# Patient Record
Sex: Female | Born: 1980 | Race: Black or African American | Hispanic: No | Marital: Single | Smoking: Current every day smoker
Health system: Southern US, Community
[De-identification: ages and names within clinical notes are randomized; demographics above are authoritative.]

## PROBLEM LIST (undated history)

## (undated) DIAGNOSIS — F319 Bipolar disorder, unspecified: Secondary | ICD-10-CM

## (undated) DIAGNOSIS — I509 Heart failure, unspecified: Secondary | ICD-10-CM

## (undated) DIAGNOSIS — I10 Essential (primary) hypertension: Secondary | ICD-10-CM

## (undated) DIAGNOSIS — K219 Gastro-esophageal reflux disease without esophagitis: Secondary | ICD-10-CM

## (undated) DIAGNOSIS — R569 Unspecified convulsions: Secondary | ICD-10-CM

## (undated) HISTORY — PX: NO PAST SURGERIES: SHX2092

---
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
## 1998-01-29 ENCOUNTER — Emergency Department (HOSPITAL_COMMUNITY): Admission: EM | Admit: 1998-01-29 | Discharge: 1998-01-29 | Payer: Self-pay | Admitting: Emergency Medicine

## 1998-01-29 ENCOUNTER — Encounter: Payer: Self-pay | Admitting: Emergency Medicine

## 1998-04-24 ENCOUNTER — Emergency Department (HOSPITAL_COMMUNITY): Admission: EM | Admit: 1998-04-24 | Discharge: 1998-04-25 | Payer: Self-pay | Admitting: Emergency Medicine

## 1998-04-28 ENCOUNTER — Emergency Department (HOSPITAL_COMMUNITY): Admission: EM | Admit: 1998-04-28 | Discharge: 1998-04-28 | Payer: Self-pay | Admitting: Emergency Medicine

## 2002-05-11 ENCOUNTER — Encounter: Payer: Self-pay | Admitting: Emergency Medicine

## 2002-05-11 ENCOUNTER — Emergency Department (HOSPITAL_COMMUNITY): Admission: EM | Admit: 2002-05-11 | Discharge: 2002-05-11 | Payer: Self-pay | Admitting: Emergency Medicine

## 2002-05-18 ENCOUNTER — Other Ambulatory Visit: Admission: RE | Admit: 2002-05-18 | Discharge: 2002-05-18 | Payer: Self-pay | Admitting: Obstetrics and Gynecology

## 2002-07-09 ENCOUNTER — Encounter: Payer: Self-pay | Admitting: Emergency Medicine

## 2002-07-09 ENCOUNTER — Emergency Department (HOSPITAL_COMMUNITY): Admission: EM | Admit: 2002-07-09 | Discharge: 2002-07-09 | Payer: Self-pay | Admitting: Emergency Medicine

## 2002-09-07 ENCOUNTER — Encounter: Admission: RE | Admit: 2002-09-07 | Discharge: 2002-09-07 | Payer: Self-pay | Admitting: Endocrinology

## 2002-09-07 ENCOUNTER — Encounter: Payer: Self-pay | Admitting: Endocrinology

## 2004-03-19 ENCOUNTER — Emergency Department (HOSPITAL_COMMUNITY): Admission: EM | Admit: 2004-03-19 | Discharge: 2004-03-19 | Payer: Self-pay | Admitting: Family Medicine

## 2004-03-28 ENCOUNTER — Inpatient Hospital Stay (HOSPITAL_COMMUNITY): Admission: EM | Admit: 2004-03-28 | Discharge: 2004-03-29 | Payer: Self-pay | Admitting: Emergency Medicine

## 2004-03-29 ENCOUNTER — Ambulatory Visit: Payer: Self-pay | Admitting: Psychiatry

## 2004-03-29 ENCOUNTER — Inpatient Hospital Stay (HOSPITAL_COMMUNITY): Admission: AD | Admit: 2004-03-29 | Discharge: 2004-04-01 | Payer: Self-pay | Admitting: Psychiatry

## 2004-04-16 ENCOUNTER — Emergency Department (HOSPITAL_COMMUNITY): Admission: EM | Admit: 2004-04-16 | Discharge: 2004-04-16 | Payer: Self-pay | Admitting: Emergency Medicine

## 2004-05-08 ENCOUNTER — Inpatient Hospital Stay (HOSPITAL_COMMUNITY): Admission: AD | Admit: 2004-05-08 | Discharge: 2004-05-08 | Payer: Self-pay | Admitting: Obstetrics and Gynecology

## 2004-05-29 ENCOUNTER — Other Ambulatory Visit: Admission: RE | Admit: 2004-05-29 | Discharge: 2004-05-29 | Payer: Self-pay | Admitting: Obstetrics and Gynecology

## 2004-08-02 ENCOUNTER — Inpatient Hospital Stay (HOSPITAL_COMMUNITY): Admission: EM | Admit: 2004-08-02 | Discharge: 2004-08-06 | Payer: Self-pay | Admitting: Psychiatry

## 2004-08-02 ENCOUNTER — Ambulatory Visit: Payer: Self-pay | Admitting: Psychiatry

## 2005-01-30 ENCOUNTER — Inpatient Hospital Stay (HOSPITAL_COMMUNITY): Admission: RE | Admit: 2005-01-30 | Discharge: 2005-02-03 | Payer: Self-pay | Admitting: Psychiatry

## 2005-01-31 ENCOUNTER — Ambulatory Visit: Payer: Self-pay | Admitting: Psychiatry

## 2005-05-04 ENCOUNTER — Emergency Department (HOSPITAL_COMMUNITY): Admission: EM | Admit: 2005-05-04 | Discharge: 2005-05-04 | Payer: Self-pay | Admitting: Emergency Medicine

## 2005-07-30 ENCOUNTER — Ambulatory Visit: Payer: Self-pay | Admitting: Family Medicine

## 2005-08-04 ENCOUNTER — Emergency Department (HOSPITAL_COMMUNITY): Admission: EM | Admit: 2005-08-04 | Discharge: 2005-08-04 | Payer: Self-pay | Admitting: Emergency Medicine

## 2005-09-19 ENCOUNTER — Encounter: Payer: Self-pay | Admitting: Emergency Medicine

## 2005-09-20 ENCOUNTER — Ambulatory Visit: Payer: Self-pay | Admitting: Psychiatry

## 2005-09-20 ENCOUNTER — Inpatient Hospital Stay (HOSPITAL_COMMUNITY): Admission: EM | Admit: 2005-09-20 | Discharge: 2005-09-22 | Payer: Self-pay | Admitting: Psychiatry

## 2010-04-27 ENCOUNTER — Emergency Department (HOSPITAL_COMMUNITY)
Admission: EM | Admit: 2010-04-27 | Discharge: 2010-04-27 | Discharge status: Against Medical Advice | Payer: Medicare Other | Attending: Emergency Medicine | Admitting: Emergency Medicine

## 2010-04-27 ENCOUNTER — Emergency Department (HOSPITAL_COMMUNITY): Payer: Medicare Other

## 2010-04-27 DIAGNOSIS — R05 Cough: Secondary | ICD-10-CM | POA: Insufficient documentation

## 2010-04-27 DIAGNOSIS — I509 Heart failure, unspecified: Secondary | ICD-10-CM | POA: Insufficient documentation

## 2010-04-27 DIAGNOSIS — R0602 Shortness of breath: Secondary | ICD-10-CM | POA: Insufficient documentation

## 2010-04-27 DIAGNOSIS — R0609 Other forms of dyspnea: Secondary | ICD-10-CM | POA: Insufficient documentation

## 2010-04-27 DIAGNOSIS — F411 Generalized anxiety disorder: Secondary | ICD-10-CM | POA: Insufficient documentation

## 2010-04-27 DIAGNOSIS — M542 Cervicalgia: Secondary | ICD-10-CM | POA: Insufficient documentation

## 2010-04-27 DIAGNOSIS — F3289 Other specified depressive episodes: Secondary | ICD-10-CM | POA: Insufficient documentation

## 2010-04-27 DIAGNOSIS — E669 Obesity, unspecified: Secondary | ICD-10-CM | POA: Insufficient documentation

## 2010-04-27 DIAGNOSIS — R079 Chest pain, unspecified: Secondary | ICD-10-CM | POA: Insufficient documentation

## 2010-04-27 DIAGNOSIS — F329 Major depressive disorder, single episode, unspecified: Secondary | ICD-10-CM | POA: Insufficient documentation

## 2010-04-27 DIAGNOSIS — R0989 Other specified symptoms and signs involving the circulatory and respiratory systems: Secondary | ICD-10-CM | POA: Insufficient documentation

## 2010-04-27 DIAGNOSIS — R059 Cough, unspecified: Secondary | ICD-10-CM | POA: Insufficient documentation

## 2010-04-27 DIAGNOSIS — R51 Headache: Secondary | ICD-10-CM | POA: Insufficient documentation

## 2010-04-27 DIAGNOSIS — M79609 Pain in unspecified limb: Secondary | ICD-10-CM | POA: Insufficient documentation

## 2010-04-27 DIAGNOSIS — H5710 Ocular pain, unspecified eye: Secondary | ICD-10-CM | POA: Insufficient documentation

## 2010-06-03 ENCOUNTER — Emergency Department (HOSPITAL_COMMUNITY)
Admission: EM | Admit: 2010-06-03 | Discharge: 2010-06-03 | Disposition: A | Discharge status: Home / Self Care | Payer: Medicare Other | Attending: Emergency Medicine | Admitting: Emergency Medicine

## 2010-06-03 DIAGNOSIS — R000 Tachycardia, unspecified: Secondary | ICD-10-CM | POA: Insufficient documentation

## 2010-06-03 DIAGNOSIS — IMO0002 Reserved for concepts with insufficient information to code with codable children: Secondary | ICD-10-CM | POA: Insufficient documentation

## 2010-06-03 DIAGNOSIS — F209 Schizophrenia, unspecified: Secondary | ICD-10-CM | POA: Insufficient documentation

## 2010-06-03 DIAGNOSIS — F411 Generalized anxiety disorder: Secondary | ICD-10-CM | POA: Insufficient documentation

## 2010-06-03 LAB — CBC
HCT: 39.4 % (ref 36.0–46.0)
Hemoglobin: 13 g/dL (ref 12.0–15.0)
MCH: 29.4 pg (ref 26.0–34.0)
MCHC: 33 g/dL (ref 30.0–36.0)
MCV: 89.1 fL (ref 78.0–100.0)
Platelets: 228 10*3/uL (ref 150–400)
RBC: 4.42 MIL/uL (ref 3.87–5.11)
RDW: 12.4 % (ref 11.5–15.5)
WBC: 8 10*3/uL (ref 4.0–10.5)

## 2010-06-03 LAB — DIFFERENTIAL
Basophils Absolute: 0 10*3/uL (ref 0.0–0.1)
Basophils Relative: 0 % (ref 0–1)
Eosinophils Absolute: 0.1 10*3/uL (ref 0.0–0.7)
Eosinophils Relative: 1 % (ref 0–5)
Lymphocytes Relative: 38 % (ref 12–46)
Lymphs Abs: 3.1 10*3/uL (ref 0.7–4.0)
Monocytes Absolute: 0.6 10*3/uL (ref 0.1–1.0)
Monocytes Relative: 8 % (ref 3–12)
Neutro Abs: 4.3 10*3/uL (ref 1.7–7.7)
Neutrophils Relative %: 53 % (ref 43–77)

## 2010-06-03 LAB — BASIC METABOLIC PANEL
Calcium: 9.5 mg/dL (ref 8.4–10.5)
Creatinine, Ser: 0.98 mg/dL (ref 0.4–1.2)
GFR calc Af Amer: >60 mL/min (ref >60)
GFR calc non Af Amer: >60 mL/min (ref >60)
Glucose, Bld: 97 mg/dL (ref 70–99)
Sodium: 137 mEq/L (ref 135–145)

## 2010-06-03 LAB — RAPID URINE DRUG SCREEN, HOSP PERFORMED
Opiates: NOT DETECTED
Tetrahydrocannabinol: NOT DETECTED

## 2010-06-03 LAB — GLUCOSE, CAPILLARY: Glucose-Capillary: 89 mg/dL (ref 70–99)

## 2010-08-19 ENCOUNTER — Inpatient Hospital Stay (HOSPITAL_COMMUNITY)
Admission: AD | Admit: 2010-08-19 | Discharge: 2010-08-24 | DRG: 885 | Disposition: A | Discharge status: Home / Self Care | Payer: Medicare Other | Source: Ambulatory Visit | Attending: Psychiatry | Admitting: Psychiatry

## 2010-08-19 ENCOUNTER — Emergency Department (HOSPITAL_COMMUNITY)
Admission: EM | Admit: 2010-08-19 | Discharge: 2010-08-19 | Disposition: A | Discharge status: Other Acute Inpatient Hospital | Payer: Medicare Other | Source: Home / Self Care | Attending: Emergency Medicine | Admitting: Emergency Medicine

## 2010-08-19 DIAGNOSIS — R45851 Suicidal ideations: Secondary | ICD-10-CM | POA: Insufficient documentation

## 2010-08-19 DIAGNOSIS — F411 Generalized anxiety disorder: Secondary | ICD-10-CM | POA: Insufficient documentation

## 2010-08-19 DIAGNOSIS — F329 Major depressive disorder, single episode, unspecified: Secondary | ICD-10-CM | POA: Insufficient documentation

## 2010-08-19 DIAGNOSIS — I509 Heart failure, unspecified: Secondary | ICD-10-CM

## 2010-08-19 DIAGNOSIS — F3160 Bipolar disorder, current episode mixed, unspecified: Principal | ICD-10-CM

## 2010-08-19 DIAGNOSIS — F3289 Other specified depressive episodes: Secondary | ICD-10-CM | POA: Insufficient documentation

## 2010-08-19 DIAGNOSIS — IMO0001 Reserved for inherently not codable concepts without codable children: Secondary | ICD-10-CM

## 2010-08-19 DIAGNOSIS — R443 Hallucinations, unspecified: Secondary | ICD-10-CM | POA: Insufficient documentation

## 2010-08-19 LAB — URINE MICROSCOPIC-ADD ON

## 2010-08-19 LAB — COMPREHENSIVE METABOLIC PANEL
ALT: 17 U/L (ref 0–35)
Alkaline Phosphatase: 48 U/L (ref 39–117)
BUN: 6 mg/dL (ref 6–23)
CO2: 20 mEq/L (ref 19–32)
Chloride: 107 mEq/L (ref 96–112)
Glucose, Bld: 91 mg/dL (ref 70–99)
Potassium: 4 mEq/L (ref 3.5–5.1)
Sodium: 138 mEq/L (ref 135–145)
Total Bilirubin: 0.3 mg/dL (ref 0.3–1.2)

## 2010-08-19 LAB — CBC
HCT: 38.6 % (ref 36.0–46.0)
Hemoglobin: 13.6 g/dL (ref 12.0–15.0)
MCV: 85.6 fL (ref 78.0–100.0)
RBC: 4.51 MIL/uL (ref 3.87–5.11)
WBC: 7.8 10*3/uL (ref 4.0–10.5)

## 2010-08-19 LAB — URINALYSIS, ROUTINE W REFLEX MICROSCOPIC
Glucose, UA: NEGATIVE mg/dL
Ketones, ur: 15 mg/dL — AB
Nitrite: NEGATIVE
Protein, ur: NEGATIVE mg/dL
pH: 7 (ref 5.0–8.0)

## 2010-08-19 LAB — DIFFERENTIAL
Basophils Absolute: 0 10*3/uL (ref 0.0–0.1)
Eosinophils Relative: 1 % (ref 0–5)
Lymphocytes Relative: 36 % (ref 12–46)
Lymphs Abs: 2.8 10*3/uL (ref 0.7–4.0)
Neutro Abs: 4.3 10*3/uL (ref 1.7–7.7)
Neutrophils Relative %: 56 % (ref 43–77)

## 2010-08-19 LAB — RAPID URINE DRUG SCREEN, HOSP PERFORMED
Cocaine: NOT DETECTED
Tetrahydrocannabinol: NOT DETECTED

## 2010-08-19 LAB — POCT PREGNANCY, URINE: Preg Test, Ur: NEGATIVE

## 2010-08-19 LAB — ETHANOL: Alcohol, Ethyl (B): <11 mg/dL — ABNORMAL HIGH (ref 0–10)

## 2010-08-20 LAB — URINE CULTURE
Culture  Setup Time: 201206061256
Culture: NO GROWTH

## 2010-08-21 DIAGNOSIS — F259 Schizoaffective disorder, unspecified: Secondary | ICD-10-CM

## 2010-08-21 LAB — TSH: TSH: 3.596 u[IU]/mL (ref 0.350–4.500)

## 2010-08-22 NOTE — H&P (Signed)
NAME:  Rebecca Lozano, [name]                  ACCOUNT NO.:  [id number]  MEDICAL RECORD NO.:  [id number]  LOCATION:  0405                          FACILITY:  [facility]  PHYSICIAN:  Eulogio Ditch, MD DATE OF BIRTH:  Jul 15, 1980  DATE OF ADMISSION:  08/19/2010 DATE OF DISCHARGE:                      PSYCHIATRIC ADMISSION ASSESSMENT   DATE OF ASSESSMENT:  August 20, 2010.  IDENTIFICATION:  This is a 30 year old African American female.  This is a voluntary admission.  HISTORY OF PRESENT ILLNESS:  This is the first [facility] admission for Rebecca Lozano who presents by way of our emergency room complaining of suicidal thoughts.  She reports she has been dealing with a lot of family stress, and her medication had been recently switched from Lexapro to Wellbutrin 2 weeks ago.  Since then she had noticed an increase in auditory hallucinations and felt that her mood was more unstable.  She has had some thoughts of overdosing.  PAST PSYCHIATRIC HISTORY:  Currently followed as an outpatient by [facility] counseling.  The patient reports history of previous admissions to almost every psychiatric hospital in our state.  Says this started around 2000 when she was abusing various substances and was first admitted to [facility] in [place], [place].  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  Previously being [place] at mental health services in [place] until she moved to [place] in January 2012.  Prior to the mood, she had had frequent trials of various medications in an attempt to deal with her mood lability.  This involved taking Xanax followed by Saphris and several other kinds of medications.  She has been on Topamax a very long time for mood stability.  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  She is requesting help with mood stability.  She is pleased with taking the Topamax because it has helped her with weight control.  SOCIAL HISTORY:  Single African American female. Reports some frustration with social relationships.  Now living in transitional housing in [place].  Has a brother who lives locally and attends a [employer].  She reports that she is attending group therapy through [facility] counseling.  MEDICAL HISTORY:  Primary care physician is Dr. Aundria Rud at the [facility].  Medical problems are fibromyalgia and history of congestive heart failure.  CURRENT MEDICATIONS: 1. Wellbutrin SR 150 mg 1 tablet daily. 2. Geodon 240 mg p.o. in the evening. 3. Topamax 100 mg b.i.d. 4. Nexium 40 mg daily. 5. Vitamin B12 1 tablet orally daily. 6. Flexeril 10 mg daily. 7. Ambien 10 mg q.h.s. 8. Vistaril 50 mg q.6h p.r.n. stool softener one cap daily. 9. Multivitamin one daily. 10.Lisinopril 10 mg q.a.m. 11.Cogentin 0.5 mg 1 tablet twice daily.  DRUG ALLERGIES:  SEROQUEL, LAMICTAL AND ABILIFY AND ASPIRIN AND PENICILLIN, ARTANE AND LEXAPRO.  Physical examination was done in the [facility] emergency room and is noted in the record.  This is an overweight African American female, normally-developed, adequately groomed, in no acute physical distress today, pleasant, cooperative  with a mildly labile affect, sad at times. ADMITTING VITAL SIGNS:  Temperature 99, pulse 73, respirations 16, blood pressure 126/92.  Her CBC is normal. Hemoglobin 13.6.  Chemistry normal. BUN 6, creatinine 0.95.  Liver enzymes are normal.  Alcohol screen negative. Urine drug screen negative. Pregnancy test is negative and urinalysis remarkable for 7-10 WBCs per high-powered field and a small amount of leukocyte esterase, 15 mg of ketones.  MENTAL STATUS EXAM:  Fully alert female, pleasant, cooperative, pleasant on approach. Good eye contact, constricted  affect, some lability to her affect but it is very slight. She is initially rather guarded and stand offish but warms up with conversation, is engageable, pleasant.  Has concerns about wanting to get her mood stable. Discouraged with trying to establish personal relationships.  Thinking is logical.  She does not appear internally distracted.  Denying any hallucinations. Her only complaint today is some drowsiness from an a.m. Geodon dose.  Thinking is logical.  Intelligence average.  Insight adequate.  Impulse control and judgment normal.  Has had some passive suicidal thoughts.  No plans today. Denies any active suicidal thoughts today.  DIAGNOSES:  AXIS I:  Mood disorder NOS, rule out schizoaffective disorder, bipolar type by history, history of polysubstance abuse in remission. AXIS II:  No diagnosis. AXIS III:  History of congestive heart failure and fibromyalgia. AXIS IV:  Deferred. AXIS V:  Current 40. Past year not known.  Plan is to voluntarily admit her with a goal of alleviating her suicidal thoughts and adequately stabilizing her mood.  We have already elected to discontinue the Wellbutrin as she feels that this exacerbated her suicidal thoughts.  We had initially placed her on Geodon 80 mg twice daily but will do Geodon 160 mg q.1800 with evening meal and Vistaril 50 mg in the morning and at 1800 and 50 mg one-time daily p.r.n.  We are going to continue her Topamax and talked about the social supports in [place] and some of her options.  Ego supportive psychotherapy given today, and medication education done.     Margaret A. Lorin Picket, N.P.   ______________________________ Eulogio Ditch, MD   MAS/MEDQ  D:  08/21/2010  T:  08/21/2010  Job:  [id number]  Electronically Signed by Kari Baars N.P. on 08/21/2010 05:02:28 PM Electronically Signed by Eulogio Ditch  on 08/22/2010 06:04:26 PM

## 2010-08-23 LAB — URINALYSIS, ROUTINE W REFLEX MICROSCOPIC
Bilirubin Urine: NEGATIVE
Glucose, UA: NEGATIVE mg/dL
Ketones, ur: NEGATIVE mg/dL
Protein, ur: NEGATIVE mg/dL
pH: 7.5 (ref 5.0–8.0)

## 2010-08-23 LAB — URINE MICROSCOPIC-ADD ON

## 2010-09-01 NOTE — Discharge Summary (Signed)
  NAME:  Capriotti, Jakelin                  ACCOUNT NO.:  [id number]  MEDICAL RECORD NO.:  [id number]  LOCATION:  0405                          FACILITY:  [facility]  PHYSICIAN:  Eulogio Ditch, MD DATE OF BIRTH:  20-Jul-1980  DATE OF ADMISSION:  08/19/2010 DATE OF DISCHARGE:  08/24/2010                              DISCHARGE SUMMARY   HISTORY OF PRESENT ILLNESS:  A 30 year old female who was admitted for suicidal thoughts and for paranoid behavior.  The patient reported that her medicine was switched from Lexapro to Wellbutrin 2 weeks ago.  Since then she became paranoid and started hearing voices and reported her mood was unstable.  HOSPITAL COURSE:  During the hospital stay the patient's Wellbutrin was discontinued.  The patient was on Geodon 240 mg in the evening.  She was given 160 mg and I explained to her that she need not to go up to such a high dose as the side effects increase and the benefits are not much as compared to 160 mg and approved dose is up to 160 mg p.o. daily.  The patient was continued on Topamax 100 mg b.i.d. along with Cogentin 0.5 mg 1 tablet twice daily.  The patient responded to the medication well.  She participated in all the groups and on June 11 when I saw the patient the patient was very calm, cooperative, logical and goal-directed, not suicidal or homicidal, not delusional or hallucinating.  The patient denied any side effects from the medications.  The patient reported that she lives in a community place and she has a lot of help there.  It is like a transition place where she has a lot of support.  The patient reported that she did not have any kids or any boyfriend.  She feels lonely sometimes but the place where she lives she has a lot of help.  The patient reported that she is sober from the drugs for the last 5 years.  DISCHARGE DIAGNOSES:  AXIS I:  Bipolar disorder mixed type. AXIS II:  Deferred. AXIS III:  History of congestive heart failure  and fibromyalgia. AXIS IV:  Chronic mental health issues. AXIS V:  60.  DISCHARGE MEDICATIONS: 1. Cogentin 0.5 mg p.o. daily. 2. Geodon 160 mg p.o. daily. 3. Along with her other medical meds like calcium carbonate,     lisinopril, Protonix, Topamax and Ambien.  DISCHARGE FOLLOWUP:  The patient will follow up with [facility], phone number [phone].  Appointment on June 28 at 1:00 p.m.  She will also follow up with [employer] community support team, phone number [phone], appointment of June 13 at 3:00 p.m.     Eulogio Ditch, MD     SA/MEDQ  D:  08/24/2010  T:  08/24/2010  Job:  [id number]  Electronically Signed by Eulogio Ditch  on 09/01/2010 09:55:37 AM

## 2010-09-04 ENCOUNTER — Emergency Department (HOSPITAL_COMMUNITY)
Admission: EM | Admit: 2010-09-04 | Discharge: 2010-09-04 | Discharge status: Against Medical Advice | Payer: Medicare Other | Attending: Emergency Medicine | Admitting: Emergency Medicine

## 2010-09-04 DIAGNOSIS — R300 Dysuria: Secondary | ICD-10-CM | POA: Insufficient documentation

## 2014-01-05 ENCOUNTER — Encounter (HOSPITAL_COMMUNITY): Payer: Self-pay | Admitting: Emergency Medicine

## 2014-01-05 ENCOUNTER — Emergency Department (HOSPITAL_COMMUNITY): Payer: Medicare Other

## 2014-01-05 ENCOUNTER — Emergency Department (HOSPITAL_COMMUNITY)
Admission: EM | Admit: 2014-01-05 | Discharge: 2014-01-05 | Disposition: A | Discharge status: Home / Self Care | Payer: Medicare Other | Attending: Emergency Medicine | Admitting: Emergency Medicine

## 2014-01-05 DIAGNOSIS — I509 Heart failure, unspecified: Secondary | ICD-10-CM | POA: Insufficient documentation

## 2014-01-05 DIAGNOSIS — K219 Gastro-esophageal reflux disease without esophagitis: Secondary | ICD-10-CM | POA: Insufficient documentation

## 2014-01-05 DIAGNOSIS — S4991XA Unspecified injury of right shoulder and upper arm, initial encounter: Secondary | ICD-10-CM | POA: Diagnosis present

## 2014-01-05 DIAGNOSIS — M25511 Pain in right shoulder: Secondary | ICD-10-CM

## 2014-01-05 DIAGNOSIS — I10 Essential (primary) hypertension: Secondary | ICD-10-CM | POA: Insufficient documentation

## 2014-01-05 DIAGNOSIS — Z720 Tobacco use: Secondary | ICD-10-CM | POA: Diagnosis not present

## 2014-01-05 DIAGNOSIS — F319 Bipolar disorder, unspecified: Secondary | ICD-10-CM | POA: Insufficient documentation

## 2014-01-05 DIAGNOSIS — Z79899 Other long term (current) drug therapy: Secondary | ICD-10-CM | POA: Diagnosis not present

## 2014-01-05 HISTORY — DX: Essential (primary) hypertension: I10

## 2014-01-05 HISTORY — DX: Bipolar disorder, unspecified: F31.9

## 2014-01-05 HISTORY — DX: Heart failure, unspecified: I50.9

## 2014-01-05 HISTORY — DX: Gastro-esophageal reflux disease without esophagitis: K21.9

## 2014-01-05 MED ORDER — HYDROCODONE-ACETAMINOPHEN 5-325 MG PO TABS
2.0000 | ORAL_TABLET | Freq: Once | ORAL | Status: AC
  Administered 2014-01-05: 2 via ORAL
  Filled 2014-01-05: qty 2

## 2014-01-05 MED ORDER — IBUPROFEN 800 MG PO TABS: 800.0000 mg | ORAL_TABLET | Freq: Three times a day (TID) | ORAL | Status: DC

## 2014-01-05 NOTE — ED Notes (Signed)
Patient transported to X-ray 
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
## 2014-01-05 NOTE — Discharge Instructions (Signed)
Return to the emergency room with worsening of symptoms, new symptoms or with symptoms that are concerning. RICE: Rest, Ice (three cycles of 20 mins on, 20mins off at least twice a day), compression/brace, elevation. Heating pad works well for back pain. Ibuprofen 800mg  every 6 hours for 3-5 days and then as needed for pain. Follow up with PCP if symptoms worsen or are persistent.   Adhesive Capsulitis Sometimes the shoulder becomes stiff and is painful to move. Some people say it feels as if the shoulder is frozen in place. Because of this, the condition is called "frozen shoulder." Its medical name is adhesive capsulitis.  The shoulder joint is made up of strong connective tissue that attaches the ball of the humerus to the shallow shoulder socket. This strong connective tissue is called the joint capsule. This tissue can become stiff and swollen. That is when adhesive capsulitis sets in. CAUSES  It is not always clear just what the cause adhesive capsulitis. Possibilities include:  Injury to the shoulder joint.  Strain. This is a repetitive injury brought about by overuse.  Lack of use. Perhaps your arm or hand was otherwise injured. It might have been in a sling for awhile. Or perhaps you were not using it to avoid pain.  Referred pain. This is a sort of trick the body plays. You feel pain in the shoulder. But, the pain actually comes from an injury somewhere else in the body.  Long-standing health problems. Several diseases can cause adhesive capsulitis. They include diabetes, heart disease, stroke, thyroid problems, rheumatoid arthritis and lung disease.  Being a women older than 40. Anyone can develop adhesive capsulitis but it is most common in women in this age group. SYMPTOMS   Pain.  It occurs when the arm is moved.  Parts of the shoulder might hurt if they are touched.  Pain is worse at night or when resting.  Soreness. It might not be strong enough to be called pain.  But, the shoulder aches.  The shoulder does not move freely.  Muscle spasms.  Trouble sleeping because of shoulder ache or pain. DIAGNOSIS  To decide if you have adhesive capsulitis, your healthcare provider will probably:  Ask about symptoms you have noticed.  Ask about your history of joint pain and anything that might have caused the pain.  Ask about your overall health.  Use hands to feel your shoulder and neck.  Ask you to move your shoulder in specific directions. This may indicate the origin of the pain.  Order imaging tests; pictures of the shoulder. They help pinpoint the source of the problem. An X-ray might be used. For more detail, an MRI is often used. An MRI details the tendons, muscles and ligaments as well as the joint. TREATMENT  Adhesive capsulitis can be treated several ways. Most treatments can be done in a clinic or in your healthcare provider's office. Be sure to discuss the different options with your caregiver. They include:  Physical therapy. You will work on specific exercises to get your shoulder moving again. The exercises usually involve stretching. A physical therapist (a caregiver with special training) can show you what to do and what not to do. The exercises will need to be done daily.  Medication.  Over-the-counter medicines may relieve pain and inflammation (the body's way of reacting to injury or infection).  Corticosteroids. These are stronger drugs to reduce pain and inflammation. They are given by injection (shots) into the shoulder joint. Frequent treatment is not  recommended.  Muscle relaxants. Medication may be prescribed to ease muscle spasms.  Treatment of underlying conditions. This means treating another condition that is causing your shoulder problem. This might be a rotator cuff (tendon) problem  Shoulder manipulation. The shoulder will be moved by your healthcare provider. You would be under general anesthesia (given a drug that  puts you to sleep). You would not feel anything. Sometimes the joint will be injected with salt water (saline) at high pressure to break down internal scarring in the joint capsule.  Surgery. This is rarely needed. It may be suggested in advanced cases after all other treatment has failed. PROGNOSIS  In time, most people recover from adhesive capsulitis. Sometimes, however, the pain goes away but full movement of the shoulder does not return.  HOME CARE INSTRUCTIONS   Take any pain medications recommended by your healthcare provider. Follow the directions carefully.  If you have physical therapy, follow through with the therapist's suggestions. Be sure you understand the exercises you will be doing. You should understand:  How often the exercises should be done.  How many times each exercise should be repeated.  How long they should be done.  What other activities you should do, or not do.  That you should warm up before doing any exercise. Just 5 to 10 minutes will help. Small, gentle movements should get your shoulder ready for more.  Avoid high-demand exercise that involves your shoulder such as throwing. This type of exercise can make pain worse.  Consider using cold packs. Cold may ease swelling and pain. Ask your healthcare provider if a cold pack might help you. If so, get directions on how and when to use them. SEEK MEDICAL CARE IF:   You have any questions about your medications.  Your pain continues to increase. Document Released: 12/27/2008 Document Revised: 05/24/2011 Document Reviewed: 12/27/2008 [facility] Patient Information 2015 [employer] [place], [place]. This information is not intended to replace advice given to you by your health care provider. Make sure you discuss any questions you have with your health care provider.

## 2014-01-05 NOTE — ED Notes (Signed)
Patient returned from X-ray 
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
## 2014-01-06 NOTE — ED Provider Notes (Signed)
Medical screening examination/treatment/procedure(s) were performed by non-physician practitioner and as supervising physician I was immediately available for consultation/collaboration.   EKG Interpretation None      Floraine Buechler, MD, FACEP   Jamine Highfill L Viann Nielson, MD 01/06/14 1500 

## 2014-12-12 ENCOUNTER — Emergency Department: Admit: 2014-12-12 | Payer: MEDICAID

## 2014-12-12 ENCOUNTER — Inpatient Hospital Stay
Admit: 2014-12-12 | Discharge: 2014-12-12 | Disposition: A | Discharge status: Home / Self Care | Payer: MEDICAID | Attending: Emergency Medicine

## 2014-12-12 DIAGNOSIS — S93402A Sprain of unspecified ligament of left ankle, initial encounter: Secondary | ICD-10-CM

## 2014-12-12 MED ORDER — ACETAMINOPHEN 325 MG TABLET
325 mg | Freq: Once | ORAL | Status: AC
Start: 2014-12-12 — End: 2014-12-12
  Administered 2014-12-12: 16:00:00 via ORAL

## 2014-12-12 MED ORDER — NAPROXEN 250 MG TAB
250 mg | Freq: Once | ORAL | Status: AC
Start: 2014-12-12 — End: 2014-12-12
  Administered 2014-12-12: 17:00:00 via ORAL

## 2014-12-12 MED ORDER — NAPROXEN 375 MG TAB
375 mg | ORAL_TABLET | Freq: Two times a day (BID) | ORAL | 0 refills | Status: AC
Start: 2014-12-12 — End: ?

## 2014-12-12 MED ORDER — ACETAMINOPHEN 325 MG TABLET
325 mg | ORAL_TABLET | ORAL | 0 refills | Status: AC | PRN
Start: 2014-12-12 — End: ?

## 2014-12-12 MED FILL — MAPAP (ACETAMINOPHEN) 325 MG TABLET: 325 mg | ORAL | Qty: 2

## 2014-12-12 MED FILL — NAPROXEN 250 MG TAB: 250 mg | ORAL | Qty: 2

## 2014-12-12 NOTE — ED Triage Notes (Addendum)
See nursing note

## 2014-12-12 NOTE — ED Notes (Signed)
Discharged by provider.  Left before receiving meal tray.  Snack provided with medication.  Discharged with splint and crutches.

## 2014-12-12 NOTE — ED Provider Notes (Signed)
HPI Comments: Joan Molina is a 34 y.o. Female tobacco user (1ppd x 14yrs) who presents to [facility] ED via EMS with cc left ankle and foot pain, secondary to slipped on wet steps x 45 mins prior to arrival. The patient states that she was outside walking on some wet steps, when she lost her footing and fell. She denies any head trauma or loss of consciousness during the incident. She also reports that immediately after the fall she was able to ambulate, but as more time passes it has become more and more difficult to walk or move her left foot. She denies all other symptoms at this time, including any symptoms of numbness/tingling in her foot or ankle.      PCP: None      There are no other complaints changes or physical findings at this time  Written by Beatriz Chancellor, ED Scribe as dictated by Bradd Burner, MD.       Past Medical History:   Diagnosis Date   ??? Congestive heart failure (CHF) (HCC) 2011   ??? Hypertension    ??? Psychiatric disorder      Bipolar       History reviewed. No pertinent past surgical history.      History reviewed. No pertinent family history.    Social History     Social History   ??? Marital status: SINGLE     Spouse name: N/A   ??? Number of children: N/A   ??? Years of education: N/A     Occupational History   ??? Not on file.     Social History Main Topics   ??? Smoking status: Current Every Day Smoker     Packs/day: 1.00     Years: 14.00   ??? Smokeless tobacco: Not on file   ??? Alcohol use No   ??? Drug use: Yes     Special: Marijuana   ??? Sexual activity: Not on file     Other Topics Concern   ??? Not on file     Social History Narrative   ??? No narrative on file         ALLERGIES: Penicillins    Review of Systems   Constitutional: Negative for chills and fever.   HENT: Negative for congestion and rhinorrhea.    Eyes: Negative for visual disturbance.   Respiratory: Negative for cough and shortness of breath.    Cardiovascular: Negative for chest pain.    Gastrointestinal: Negative for abdominal pain, nausea and vomiting.   Genitourinary: Negative for difficulty urinating and dysuria.   Musculoskeletal: Positive for arthralgias and myalgias. Negative for back pain and neck pain.   Skin: Negative for rash.   Neurological: Negative for dizziness, numbness and headaches.   All other systems reviewed and are negative.      Patient Vitals for the past 12 hrs:   Temp Pulse Resp BP SpO2   12/12/14 1232 - - - (!) 138/97 100 %   12/12/14 1132 98.7 ??F (37.1 ??C) 79 18 (!) 153/98 100 %       Physical Exam   Constitutional: She is oriented to person, place, and time. She appears well-developed and well-nourished.   HENT:   Head: Atraumatic.   Eyes: EOM are normal. Pupils are equal, round, and reactive to light.   Neck: Normal range of motion.   Cardiovascular: Normal rate, regular rhythm, normal heart sounds and intact distal pulses.    Pulmonary/Chest: Effort normal and breath sounds normal.  Abdominal: Soft. Bowel sounds are normal.   Musculoskeletal: She exhibits tenderness.   Left lateral ankle tenderness  Tenderness at the base of the 5th metatarsal  No knee or hip involvement   Neurological: She is alert and oriented to person, place, and time.   Skin: Skin is warm and dry.   Psychiatric: She has a normal mood and affect. Her behavior is normal.   Nursing note and vitals reviewed.       MDM  Number of Diagnoses or Management Options  Sprain of left ankle, unspecified ligament, initial encounter:   Diagnosis management comments: DDx: Strain, Fracture, Dislocation       Amount and/or Complexity of Data Reviewed  Tests in the radiology section of CPT??: ordered and reviewed  Review and summarize past medical records: yes  Independent visualization of images, tracings, or specimens: yes    Patient Progress  Patient progress: stable    ED Course       Procedures    LABORATORY TESTS:  No results found for this or any previous visit (from the past 12 hour(s)).    IMAGING RESULTS:     EXAM: XR FOOT LT MIN 3 V  History: Fall, foot pain  INDICATION: fall pain. ??  ??  COMPARISON: None.  ??  FINDINGS: Three views of the left foot demonstrate no fracture or other acute  osseous or articular abnormality. The soft tissues are within normal limits. ??  ????  ??  IMPRESSION  IMPRESSION: No acute abnormality. ??      EXAM: XR ANKLE LT MIN 3 V  Clinical history: Fall, foot pain  INDICATION: fall pain. ??  ??  COMPARISON: None.  ??  FINDINGS: Three views of the left ankle demonstrate no fracture or disruption of  the ankle mortise. There is no other acute osseous or articular abnormality. ??  The soft tissues are within normal limits. ??  ??  IMPRESSION  IMPRESSION: No acute abnormality. ??      MEDICATIONS GIVEN:  Medications   acetaminophen (TYLENOL) tablet 650 mg (650 mg Oral Given 12/12/14 1148)   naproxen (NAPROSYN) tablet 500 mg (500 mg Oral Given 12/12/14 1240)       IMPRESSION:  1. Sprain of left ankle, unspecified ligament, initial encounter        PLAN:  1.   Discharge Medication List as of 12/12/2014 12:43 PM      START taking these medications    Details   acetaminophen (TYLENOL) 325 mg tablet Take 2 Tabs by mouth every four (4) hours as needed for Pain., Normal, Disp-20 Tab, R-0      naproxen (NAPROSYN) 375 mg tablet Take 1 Tab by mouth two (2) times daily (with meals)., Normal, Disp-20 Tab, R-0         CONTINUE these medications which have NOT CHANGED    Details   ziprasidone (GEODON) 80 mg capsule Take 160 mg by mouth nightly., Historical Med      benztropine (COGENTIN) 2 mg tablet Take 1 mg by mouth two (2) times a day., Historical Med      OXcarbaxepine (TRILEPTAL) 600 mg tablet Take 600 mg by mouth two (2) times a day., Historical Med      lisinopril (PRINIVIL, ZESTRIL) 10 mg tablet Take 10 mg by mouth daily., Historical Med      esomeprazole (NEXIUM) 40 mg capsule Take 40 mg by mouth daily., Historical Med           2.   Follow-up  Information     Follow up With Details Comments Contact Info     [facility] EMERGENCY DEPT  If symptoms worsen [address] [place] [address]  [phone]    Natasha Mead, DPM Schedule an appointment as soon as possible for a visit within 1 week [address] and [employer] [place] [address]  [phone]          Return to ED if worse     Discharge Note:  12:56 PM  The patient is ready for discharge. The patient's signs, symptoms, diagnosis, and discharge instructions have been discussed and the patient has conveyed their understanding. The patient is to follow up as recommended or return to the ER should their symptoms worsen. Plan has been discussed and the patient is in agreement.      This note is prepared by Beatriz Chancellor II acting as Scribe for Bradd Burner, MD.    Bradd Burner, MD: The Scribe's documentation has been prepared under my direction and personally reviewed by me in its entirety. I confirm that the note above accurately reflects all work, treatment, procedures, and medical decision making performed by me.

## 2014-12-12 NOTE — ED Notes (Signed)
Patient brought in by EMS after fall down stairs.  States she slipped down 2-3 stairs hitting foot.  Swelling noted to outside of L ankle.  Reports tingling to toes.  Cap refill WNL, normal sensation, pedal pulses palpable.  Small abrasion noted to posterior of RLE.  Cleansed with NS, pat dry, dry dressing in place.  Denies further injury.      Emergency Department Nursing Plan of Care       The Nursing Plan of Care is developed from the Nursing assessment and Emergency Department Attending provider initial evaluation.  The plan of care may be reviewed in the ???ED Provider note???.    The Plan of Care was developed with the following considerations:   Patient / Family readiness to learn indicated ZO:[id number] understanding  Persons(s) to be included in education: patient  Barriers to Learning/Limitations:No    Signed     Jayme Cloud    12/12/2014   11:51 AM

## 2014-12-12 NOTE — ED Notes (Addendum)
1153:  Xray at bedside.    1235: Requesting food and additional pain meds.  Provider notified.

## 2015-10-02 ENCOUNTER — Encounter: Attending: Obstetrics & Gynecology

## 2016-09-27 ENCOUNTER — Encounter: Attending: Geriatric Psychiatry

## 2016-12-22 ENCOUNTER — Encounter (HOSPITAL_COMMUNITY): Payer: Self-pay | Admitting: Emergency Medicine

## 2016-12-22 ENCOUNTER — Emergency Department (HOSPITAL_COMMUNITY)
Admission: EM | Admit: 2016-12-22 | Discharge: 2016-12-22 | Disposition: A | Discharge status: Home / Self Care | Payer: Medicare (Managed Care) | Attending: Emergency Medicine | Admitting: Emergency Medicine

## 2016-12-22 DIAGNOSIS — N939 Abnormal uterine and vaginal bleeding, unspecified: Secondary | ICD-10-CM | POA: Diagnosis present

## 2016-12-22 DIAGNOSIS — Z79899 Other long term (current) drug therapy: Secondary | ICD-10-CM | POA: Insufficient documentation

## 2016-12-22 DIAGNOSIS — I509 Heart failure, unspecified: Secondary | ICD-10-CM | POA: Insufficient documentation

## 2016-12-22 DIAGNOSIS — F17210 Nicotine dependence, cigarettes, uncomplicated: Secondary | ICD-10-CM | POA: Diagnosis not present

## 2016-12-22 DIAGNOSIS — I110 Hypertensive heart disease with heart failure: Secondary | ICD-10-CM | POA: Diagnosis not present

## 2016-12-22 HISTORY — DX: Unspecified convulsions: R56.9

## 2016-12-22 LAB — CBC WITH DIFFERENTIAL/PLATELET
Basophils Absolute: 0 10*3/uL (ref 0.0–0.1)
Basophils Relative: 0 %
EOS PCT: 1 %
Eosinophils Absolute: 0.1 10*3/uL (ref 0.0–0.7)
HEMATOCRIT: 33.8 % — AB (ref 36.0–46.0)
Hemoglobin: 11.1 g/dL — ABNORMAL LOW (ref 12.0–15.0)
LYMPHS ABS: 2.2 10*3/uL (ref 0.7–4.0)
LYMPHS PCT: 33 %
MCH: 28.5 pg (ref 26.0–34.0)
MCHC: 32.8 g/dL (ref 30.0–36.0)
MCV: 86.9 fL (ref 78.0–100.0)
Monocytes Absolute: 0.6 10*3/uL (ref 0.1–1.0)
Monocytes Relative: 10 %
NEUTROS PCT: 56 %
Neutro Abs: 3.6 10*3/uL (ref 1.7–7.7)
Platelets: 380 10*3/uL (ref 150–400)
RBC: 3.89 MIL/uL (ref 3.87–5.11)
RDW: 14.5 % (ref 11.5–15.5)
WBC: 6.5 10*3/uL (ref 4.0–10.5)

## 2016-12-22 LAB — PREGNANCY, URINE: Preg Test, Ur: NEGATIVE

## 2016-12-22 MED ORDER — IBUPROFEN 800 MG PO TABS
800.0000 mg | ORAL_TABLET | Freq: Once | ORAL | Status: AC
  Administered 2016-12-22: 800 mg via ORAL
  Filled 2016-12-22: qty 1

## 2016-12-22 MED ORDER — ACETAMINOPHEN 325 MG PO TABS
650.0000 mg | ORAL_TABLET | Freq: Once | ORAL | Status: AC
  Administered 2016-12-22: 650 mg via ORAL
  Filled 2016-12-22: qty 2

## 2016-12-22 NOTE — ED Triage Notes (Addendum)
Per EMS patient c/o heavy vaginal bleeding since last night.  Unable to determine last menstrual cycle.  Also endorses lower back pain with movement and pain in her groin.

## 2016-12-22 NOTE — ED Notes (Signed)
Bed: ZO10 Expected date:  Expected time:  Means of arrival:  Comments: EMS/vag. bleeding

## 2016-12-22 NOTE — ED Notes (Signed)
Patient now stating she has pain all over.  Arguing with EDP and raising her voice.

## 2016-12-22 NOTE — ED Provider Notes (Addendum)
[facility]-EMERGENCY DEPT Provider Note   CSN: [id number] Arrival date & time: 12/22/16  0813     History   Chief Complaint Chief Complaint  Patient presents with  . Vaginal Bleeding    HPI Rebecca Lozano is a 36 y.o. female. CC: VB  HPI: Patient presents with complaint of vaginal bleeding. She states she is "pretty sure" her last menstrual period was late, but was last month". She states it is possible she is pregnant. She's had spotting starting last night and heavier bleeding with some clots today. No syncopal or lightheadedness. No fatigue or weakness. No history of anemia. She is a long convoluted story about just moving back to town and being at the shelter who states they're "giving her the run around" about letting her stay there. She states that her bag is heavy and she is tired.  Past Medical History:  Diagnosis Date  . Bipolar 1 disorder (HCC)   . CHF (congestive heart failure) (HCC)   . GERD (gastroesophageal reflux disease)   . Hypertension   . Seizures (HCC)     There are no active problems to display for this patient.   History reviewed. No pertinent surgical history.  OB History    No data available       Home Medications    Prior to Admission medications   Medication Sig Start Date End Date Taking? Authorizing Provider  acetaminophen (TYLENOL) 500 MG tablet Take 1,000 mg by mouth 2 (two) times daily as needed (pain).    [provider]  benztropine (COGENTIN) 1 MG tablet Take 1 mg by mouth 2 (two) times daily.    [provider]  ibuprofen (ADVIL,MOTRIN) 800 MG tablet Take 1 tablet (800 mg total) by mouth 3 (three) times daily. 01/05/14   Oswaldo Conroy, PA-C  lisinopril (PRINIVIL,ZESTRIL) 10 MG tablet Take 10 mg by mouth daily.    [provider]  omeprazole (PRILOSEC) 20 MG capsule Take 20 mg by mouth daily.    [provider]  oxcarbazepine (TRILEPTAL) 600 MG tablet Take 600 mg by mouth 2 (two) times daily.     [provider]  ziprasidone (GEODON) 80 MG capsule Take 80 mg by mouth 2 (two) times daily with a meal.    [provider]    Family History History reviewed. No pertinent family history.  Social History Social History  Substance Use Topics  . Smoking status: Current Every Day Smoker    Packs/day: 0.50    Types: Cigarettes  . Smokeless tobacco: Never Used  . Alcohol use No     Allergies   Patient has no known allergies.   Review of Systems Review of Systems  Constitutional: Negative for appetite change, chills, diaphoresis, fatigue and fever.  HENT: Negative for mouth sores, sore throat and trouble swallowing.   Eyes: Negative for visual disturbance.  Respiratory: Negative for cough, chest tightness, shortness of breath and wheezing.   Cardiovascular: Negative for chest pain.  Gastrointestinal: Negative for abdominal distention, abdominal pain, diarrhea, nausea and vomiting.  Endocrine: Negative for polydipsia, polyphagia and polyuria.  Genitourinary: Positive for vaginal bleeding. Negative for dysuria, frequency and hematuria.  Musculoskeletal: Negative for gait problem.  Skin: Negative for color change, pallor and rash.  Neurological: Negative for dizziness, syncope, light-headedness and headaches.  Hematological: Does not bruise/bleed easily.  Psychiatric/Behavioral: Negative for behavioral problems and confusion.     Physical Exam Updated Vital Signs BP (!) 146/93 (BP Location: Left Wrist)   Pulse 94  Temp 98.4 F (36.9 C) (Oral)   Resp 18   Ht  (1.753 m)   Wt 113.4 kg (250 lb)   LMP  (LMP Unknown)   SpO2 100%   BMI 36.92 kg/m   Physical Exam  Constitutional: She is oriented to person, place, and time. She appears well-developed and well-nourished. No distress.  HENT:  Head: Normocephalic.  Eyes: Pupils are equal, round, and reactive to light. Conjunctivae are normal. No scleral icterus.  Conjunctivae are not pale. No scleral  icterus.  Neck: Normal range of motion. Neck supple. No thyromegaly present.  Cardiovascular: Normal rate and regular rhythm.  Exam reveals no gallop and no friction rub.   No murmur heard. Pulmonary/Chest: Effort normal and breath sounds normal. No respiratory distress. She has no wheezes. She has no rales.  Abdominal: Soft. Bowel sounds are normal. She exhibits no distension. There is no tenderness. There is no rebound.  Musculoskeletal: Normal range of motion.  Neurological: She is alert and oriented to person, place, and time.  Skin: Skin is warm and dry. No rash noted.  Psychiatric: She has a normal mood and affect. Her behavior is normal.     ED Treatments / Results  Labs (all labs ordered are listed, but only abnormal results are displayed) Labs Reviewed  CBC WITH DIFFERENTIAL/PLATELET - Abnormal; Notable for the following:       Result Value   Hemoglobin 11.1 (*)    HCT 33.8 (*)    All other components within normal limits  PREGNANCY, URINE    EKG  EKG Interpretation None       Radiology No results found.  Procedures Procedures (including critical care time)  Medications Ordered in ED Medications  ibuprofen (ADVIL,MOTRIN) tablet 800 mg (not administered)  acetaminophen (TYLENOL) tablet 650 mg (650 mg Oral Given 12/22/16 0847)     Initial Impression / Assessment and Plan / ED Course  I have reviewed the triage vital signs and the nursing notes.  Pertinent labs & imaging results that were available during my care of the patient were reviewed by me and considered in my medical decision making (see chart for details).     Complains of back pain. No pelvic pain or discharge. No indication for imaging or pelvic exam. No pain or infectious symptoms. Likely simple dysmenorrhea and difficult social situation. Appropriate for discharge. Tylenol Motrin when necessary. Primary care follow-up.  Final Clinical Impressions(s) / ED Diagnoses   Final diagnoses:    Vaginal bleeding    Pt calm, interactive, pleasant, and showering myself and staff with compliments and" thank you's". Upon being told that she was being discharged she immediately tells me that she has developed pain in her left posterior shoulder, right knee, and down her left leg. She is agitated belligerent and cursing at staff. I think she is provided with appropriate for discharge she has not had these complains upon arrival during my initial exam. She was in the touring around walking without apparent difficulty. Appropriate for discharge.  New Prescriptions New Prescriptions   No medications on file     Rolland Porter, MD 12/22/16 4098    Rolland Porter, MD 12/22/16 [phone]

## 2016-12-22 NOTE — ED Notes (Signed)
ED Provider at bedside. 

## 2016-12-22 NOTE — Discharge Instructions (Signed)
Motrin or Tylenol for cramps.  Follow up with your primary care physician.

## 2016-12-22 NOTE — ED Notes (Signed)
Patient continuously yelling and cussing at staff.  Security at the bedside. Patient given motrin and discharge paperwork.  Instructed by EDP to follow up with primary care doctor.

## 2017-02-10 ENCOUNTER — Inpatient Hospital Stay (HOSPITAL_COMMUNITY)
Admission: AD | Admit: 2017-02-10 | Discharge: 2017-02-10 | Disposition: A | Discharge status: Home / Self Care | Payer: Medicare (Managed Care) | Source: Ambulatory Visit | Attending: Obstetrics & Gynecology | Admitting: Obstetrics & Gynecology

## 2017-02-10 DIAGNOSIS — Z79899 Other long term (current) drug therapy: Secondary | ICD-10-CM | POA: Diagnosis not present

## 2017-02-10 DIAGNOSIS — I110 Hypertensive heart disease with heart failure: Secondary | ICD-10-CM | POA: Diagnosis not present

## 2017-02-10 DIAGNOSIS — Z3202 Encounter for pregnancy test, result negative: Secondary | ICD-10-CM | POA: Insufficient documentation

## 2017-02-10 DIAGNOSIS — I509 Heart failure, unspecified: Secondary | ICD-10-CM | POA: Diagnosis not present

## 2017-02-10 DIAGNOSIS — F17210 Nicotine dependence, cigarettes, uncomplicated: Secondary | ICD-10-CM | POA: Diagnosis not present

## 2017-02-10 DIAGNOSIS — R609 Edema, unspecified: Secondary | ICD-10-CM | POA: Diagnosis not present

## 2017-02-10 DIAGNOSIS — R569 Unspecified convulsions: Secondary | ICD-10-CM | POA: Insufficient documentation

## 2017-02-10 DIAGNOSIS — F319 Bipolar disorder, unspecified: Secondary | ICD-10-CM | POA: Diagnosis not present

## 2017-02-10 DIAGNOSIS — K219 Gastro-esophageal reflux disease without esophagitis: Secondary | ICD-10-CM | POA: Diagnosis not present

## 2017-02-10 DIAGNOSIS — M7989 Other specified soft tissue disorders: Secondary | ICD-10-CM | POA: Insufficient documentation

## 2017-02-10 LAB — URINALYSIS, ROUTINE W REFLEX MICROSCOPIC
GLUCOSE, UA: NEGATIVE mg/dL
Hgb urine dipstick: NEGATIVE
Ketones, ur: 20 mg/dL — AB
NITRITE: NEGATIVE
Protein, ur: 30 mg/dL — AB
Specific Gravity, Urine: 1.031 — ABNORMAL HIGH (ref 1.005–1.030)
pH: 5 (ref 5.0–8.0)

## 2017-02-10 LAB — RAPID URINE DRUG SCREEN, HOSP PERFORMED
AMPHETAMINES: NOT DETECTED
BENZODIAZEPINES: NOT DETECTED
Barbiturates: NOT DETECTED
Cocaine: NOT DETECTED
OPIATES: NOT DETECTED
Tetrahydrocannabinol: NOT DETECTED

## 2017-02-10 LAB — POCT PREGNANCY, URINE: PREG TEST UR: NEGATIVE

## 2017-02-10 NOTE — MAU Provider Note (Signed)
History     CSN: [id number]  Arrival date and time: 02/10/17 [id number]   First Provider Initiated Contact with Patient 02/10/17 0300     Chief Complaint  Patient presents with  . Edema   HPI Rebecca Lozano is a 36 y.o. non pregnant female who presents via EMS for leg swelling. She states the swelling has been ongoing for the last week. She rates the pain a 10/10 and has not tried anything for the pain. The patient claims to be 3 months pregnant but was not pregnant in the [facility] last month.   OB History    No data available      Past Medical History:  Diagnosis Date  . Bipolar 1 disorder (HCC)   . CHF (congestive heart failure) (HCC)   . GERD (gastroesophageal reflux disease)   . Hypertension   . Seizures (HCC)     No past surgical history on file.  No family history on file.  Social History   Tobacco Use  . Smoking status: Current Every Day Smoker    Packs/day: 0.50    Types: Cigarettes  . Smokeless tobacco: Never Used  Substance Use Topics  . Alcohol use: No  . Drug use: No    Allergies: No Known Allergies  Medications Prior to Admission  Medication Sig Dispense Refill Last Dose  . acetaminophen (TYLENOL) 500 MG tablet Take 1,000 mg by mouth 2 (two) times daily as needed (pain).   unknown  . benztropine (COGENTIN) 1 MG tablet Take 1 mg by mouth 2 (two) times daily.   01/05/2014 at am  . ibuprofen (ADVIL,MOTRIN) 800 MG tablet Take 1 tablet (800 mg total) by mouth 3 (three) times daily. 21 tablet 0   . lisinopril (PRINIVIL,ZESTRIL) 10 MG tablet Take 10 mg by mouth daily.   01/05/2014 at Unknown time  . omeprazole (PRILOSEC) 20 MG capsule Take 20 mg by mouth daily.   01/05/2014 at Unknown time  . oxcarbazepine (TRILEPTAL) 600 MG tablet Take 600 mg by mouth 2 (two) times daily.   01/05/2014 at am  . ziprasidone (GEODON) 80 MG capsule Take 80 mg by mouth 2 (two) times daily with a meal.   01/05/2014 at am    Review of Systems  Constitutional: Negative.  Negative for  fatigue and fever.  HENT: Negative.   Respiratory: Negative.  Negative for shortness of breath.   Cardiovascular: Negative.  Negative for chest pain.  Gastrointestinal: Negative.  Negative for abdominal pain, constipation, diarrhea, nausea and vomiting.  Genitourinary: Negative.  Negative for dysuria.  Musculoskeletal: Positive for joint swelling.       Foot pain and swelling   Neurological: Negative.  Negative for dizziness and headaches.   Physical Exam   Blood pressure 136/69, pulse (!) 115, temperature 99 F (37.2 C), temperature source Oral, resp. rate 20, SpO2 100 %.  Physical Exam  Nursing note and vitals reviewed. Constitutional: She is oriented to person, place, and time. She appears distressed.  HENT:  Head: Normocephalic.  Eyes: Pupils are equal, round, and reactive to light.  Respiratory: Effort normal.  Neurological: She is alert and oriented to person, place, and time.  Psychiatric: Her affect is angry and labile. Her speech is rapid and/or pressured. She is agitated, aggressive and combative.    MAU Course  Procedures Results for orders placed or performed during the hospital encounter of 02/10/17 (from the past 24 hour(s))  Urine rapid drug screen (hosp performed)     Status: None  Collection Time: 02/10/17  3:04 AM  Result Value Ref Range   Opiates NONE DETECTED NONE DETECTED   Cocaine NONE DETECTED NONE DETECTED   Benzodiazepines NONE DETECTED NONE DETECTED   Amphetamines NONE DETECTED NONE DETECTED   Tetrahydrocannabinol NONE DETECTED NONE DETECTED   Barbiturates NONE DETECTED NONE DETECTED  Pregnancy, urine POC     Status: None   Collection Time: 02/10/17  3:10 AM  Result Value Ref Range   Preg Test, Ur NEGATIVE NEGATIVE   MDM UA, UDS UPT  Upon entering room, patient belligerent and demanding to know why CNM at bedside. CNM explained she was here to examine patient and treat her pain. Patient asked what CNM "does for a living" and she is "not ready  to deliver so I don't need a midwife." Explained to patient how MAU works and informed patient that she was not pregnant. Patient began screaming at [facility] to find another provider. Patient declined all evaluation by CNM and repeatedly screamed and cursed at [facility]. CNM offered multiple times to evaluate patient for presenting complaint and patient refused. Patient continued to scream and curse at provider. House Coverage and Security at bedside to escort patient from MAU.  Patient screamed at security that "if you want me leave, you have to call the cops." Security called [employer] to escort patient from the unit  Assessment and Plan   1. Edema, unspecified type    -[employer] at bedside attempting to escort patient from the hospital. Patient continued to scream at [facility], spit and throw things. Patient escorted from hospital premises in handcuffs by [employer]. All patient belongings given to [employer].  Rolm BookbinderCaroline M Neill CNM 02/10/2017, 3:56 AM

## 2017-02-10 NOTE — Discharge Instructions (Signed)

## 2017-02-10 NOTE — MAU Note (Signed)
PT ARRIVED VIA EMS AT 0200- UPON ENTERING ROOM TO ASSESS PT - SHE WAS TALKING AND MUMBLING,  THEN TOLD ME TO LEAVE THE ROOM - I WAS FIRED .   OTHER RN'S WENT INTO ROOM TO ASSESS - SHE FUSSED AND CUSSED - TOLD  TO LEAVE  ROOM. NOTIFIED SECURITY AND HOUSE COVERAGE      SHE DID HAVE COOPERATIVE COMMUNICATION WITH CNA .    WHILE NO ONE IN ROOM  - COULD HEAR PT SCREAMING , CURSING  .   UPON D/C  - SHE REFUSED - CURSING AND HOLLERING AT STAFF .  SECURITY CALLED [employer] .  AT 0430- PT ESCORTED/ HANDCUFFED    OUT BY [employer].

## 2017-03-05 ENCOUNTER — Encounter (HOSPITAL_COMMUNITY): Payer: Self-pay

## 2017-03-05 ENCOUNTER — Emergency Department (HOSPITAL_COMMUNITY)
Admission: EM | Admit: 2017-03-05 | Discharge: 2017-03-09 | Disposition: A | Discharge status: Home / Self Care | Payer: Medicare (Managed Care) | Attending: Emergency Medicine | Admitting: Emergency Medicine

## 2017-03-05 ENCOUNTER — Emergency Department (HOSPITAL_COMMUNITY): Payer: Medicare (Managed Care)

## 2017-03-05 DIAGNOSIS — Z79899 Other long term (current) drug therapy: Secondary | ICD-10-CM | POA: Diagnosis not present

## 2017-03-05 DIAGNOSIS — F17210 Nicotine dependence, cigarettes, uncomplicated: Secondary | ICD-10-CM | POA: Insufficient documentation

## 2017-03-05 DIAGNOSIS — M25552 Pain in left hip: Secondary | ICD-10-CM | POA: Insufficient documentation

## 2017-03-05 DIAGNOSIS — X810XXA Intentional self-harm by jumping or lying in front of motor vehicle, initial encounter: Secondary | ICD-10-CM | POA: Diagnosis not present

## 2017-03-05 DIAGNOSIS — R443 Hallucinations, unspecified: Secondary | ICD-10-CM | POA: Insufficient documentation

## 2017-03-05 DIAGNOSIS — I110 Hypertensive heart disease with heart failure: Secondary | ICD-10-CM | POA: Diagnosis not present

## 2017-03-05 DIAGNOSIS — F319 Bipolar disorder, unspecified: Secondary | ICD-10-CM | POA: Insufficient documentation

## 2017-03-05 DIAGNOSIS — R4589 Other symptoms and signs involving emotional state: Secondary | ICD-10-CM | POA: Diagnosis not present

## 2017-03-05 DIAGNOSIS — R4689 Other symptoms and signs involving appearance and behavior: Secondary | ICD-10-CM

## 2017-03-05 DIAGNOSIS — I509 Heart failure, unspecified: Secondary | ICD-10-CM | POA: Insufficient documentation

## 2017-03-05 DIAGNOSIS — R45851 Suicidal ideations: Secondary | ICD-10-CM | POA: Diagnosis not present

## 2017-03-05 DIAGNOSIS — Z046 Encounter for general psychiatric examination, requested by authority: Secondary | ICD-10-CM | POA: Diagnosis present

## 2017-03-05 LAB — CBC
HCT: 27.1 % — ABNORMAL LOW (ref 36.0–46.0)
HEMOGLOBIN: 8.5 g/dL — AB (ref 12.0–15.0)
MCH: 24.2 pg — AB (ref 26.0–34.0)
MCHC: 31.4 g/dL (ref 30.0–36.0)
MCV: 77.2 fL — ABNORMAL LOW (ref 78.0–100.0)
Platelets: 466 10*3/uL — ABNORMAL HIGH (ref 150–400)
RBC: 3.51 MIL/uL — AB (ref 3.87–5.11)
RDW: 14.9 % (ref 11.5–15.5)
WBC: 5 10*3/uL (ref 4.0–10.5)

## 2017-03-05 LAB — COMPREHENSIVE METABOLIC PANEL
ALT: 17 U/L (ref 14–54)
AST: 29 U/L (ref 15–41)
Albumin: 4.1 g/dL (ref 3.5–5.0)
Alkaline Phosphatase: 53 U/L (ref 38–126)
Anion gap: 10 (ref 5–15)
CHLORIDE: 104 mmol/L (ref 101–111)
CO2: 21 mmol/L — ABNORMAL LOW (ref 22–32)
CREATININE: 0.66 mg/dL (ref 0.44–1.00)
Calcium: 9.1 mg/dL (ref 8.9–10.3)
GFR calc Af Amer: >60 mL/min (ref >60)
GFR calc non Af Amer: >60 mL/min (ref >60)
Glucose, Bld: 89 mg/dL (ref 65–99)
Potassium: 3 mmol/L — ABNORMAL LOW (ref 3.5–5.1)
Sodium: 135 mmol/L (ref 135–145)
Total Bilirubin: 1 mg/dL (ref 0.3–1.2)
Total Protein: 7.7 g/dL (ref 6.5–8.1)

## 2017-03-05 LAB — RAPID URINE DRUG SCREEN, HOSP PERFORMED
AMPHETAMINES: NOT DETECTED
BENZODIAZEPINES: NOT DETECTED
Barbiturates: NOT DETECTED
Cocaine: NOT DETECTED
OPIATES: NOT DETECTED
TETRAHYDROCANNABINOL: NOT DETECTED

## 2017-03-05 LAB — ACETAMINOPHEN LEVEL: Acetaminophen (Tylenol), Serum: <10 ug/mL — ABNORMAL LOW (ref 10–30)

## 2017-03-05 LAB — I-STAT BETA HCG BLOOD, ED (MC, WL, AP ONLY): I-stat hCG, quantitative: <5 m[IU]/mL (ref <5)

## 2017-03-05 LAB — SALICYLATE LEVEL: Salicylate Lvl: <7 mg/dL (ref 2.8–30.0)

## 2017-03-05 LAB — ETHANOL: Alcohol, Ethyl (B): <10 mg/dL (ref <10)

## 2017-03-05 MED ORDER — LORAZEPAM 2 MG/ML IJ SOLN
2.0000 mg | Freq: Once | INTRAMUSCULAR | Status: AC
  Administered 2017-03-05: 2 mg via INTRAVENOUS
  Filled 2017-03-05: qty 1

## 2017-03-05 MED ORDER — POTASSIUM CHLORIDE CRYS ER 20 MEQ PO TBCR
40.0000 meq | EXTENDED_RELEASE_TABLET | Freq: Once | ORAL | Status: AC
  Administered 2017-03-06: 40 meq via ORAL
  Filled 2017-03-05: qty 2

## 2017-03-05 MED ORDER — ZIPRASIDONE MESYLATE 20 MG IM SOLR
20.0000 mg | Freq: Once | INTRAMUSCULAR | Status: AC
  Administered 2017-03-05: 20 mg via INTRAMUSCULAR
  Filled 2017-03-05: qty 20

## 2017-03-05 NOTE — ED Provider Notes (Signed)
[facility] EMERGENCY DEPARTMENT Provider Note   CSN: [id number] Arrival date & time: 03/05/17  1054     History   Chief Complaint Chief Complaint  Patient presents with  . BH manic/suicidal    HPI Rebecca Lozano is a 36 y.o. female is brought to ED by police after she was seen walking into traffic. Per [employer], patient verbalized she wanted to kill herself and wanted help for this. Upon arrival to ED, patient became aggressive, agitated and now refusing all treatment. History is limited as patient is uncooperative. She is occasionally verbalizing thoughts of suicide, homicide but denies them when specifically asked about these. Chart reveals no previous psych history. To me, patient states she has left hip pain that radiates down to her toes. States this has been there for the last 2-3 months. She has a documented ED visit in October where she initially presented for vaginal bleeding and right before discharge she developed pain down her left leg, at that time she was documented to be agitated, belligerent and cursing at staff. She was discharged without further evaluation. She denies falls or trauma or numbness. Also states that she is approximately 4 months pregnant, however urine pregnancy 2 months ago was negative.  HPI  Past Medical History:  Diagnosis Date  . Bipolar 1 disorder (HCC)   . CHF (congestive heart failure) (HCC)   . GERD (gastroesophageal reflux disease)   . Hypertension   . Seizures (HCC)     There are no active problems to display for this patient.   History reviewed. No pertinent surgical history.  OB History    No data available       Home Medications    Prior to Admission medications   Medication Sig Start Date End Date Taking? Authorizing Provider  acetaminophen (TYLENOL) 500 MG tablet Take 1,000 mg by mouth 2 (two) times daily as needed (pain).    [provider]  benztropine (COGENTIN) 1 MG tablet Take 1 mg by mouth 2  (two) times daily.    [provider]  ibuprofen (ADVIL,MOTRIN) 800 MG tablet Take 1 tablet (800 mg total) by mouth 3 (three) times daily. 01/05/14   Oswaldo Conroy, PA-C  lisinopril (PRINIVIL,ZESTRIL) 10 MG tablet Take 10 mg by mouth daily.    [provider]  omeprazole (PRILOSEC) 20 MG capsule Take 20 mg by mouth daily.    [provider]  oxcarbazepine (TRILEPTAL) 600 MG tablet Take 600 mg by mouth 2 (two) times daily.    [provider]  ziprasidone (GEODON) 80 MG capsule Take 80 mg by mouth 2 (two) times daily with a meal.    [provider]    Family History No family history on file.  Social History Social History   Tobacco Use  . Smoking status: Current Every Day Smoker    Packs/day: 0.50    Types: Cigarettes  . Smokeless tobacco: Never Used  Substance Use Topics  . Alcohol use: No  . Drug use: No     Allergies   Patient has no known allergies.   Review of Systems Review of Systems  Psychiatric/Behavioral: Positive for agitation, behavioral problems, hallucinations and suicidal ideas.     Physical Exam Updated Vital Signs There were no vitals taken for this visit.  Physical Exam  Constitutional: She appears well-developed and well-nourished.  Agitated. Screaming at staff.   HENT:  Head: Normocephalic and atraumatic.  Right Ear: External ear normal.  Left Ear: External ear  normal.  Nose: Nose normal.  Eyes: EOM are normal. No scleral icterus.  Neck: Normal range of motion.  Cardiovascular: Normal rate, regular rhythm and normal heart sounds.  No LE edema or calf tenderness   Pulmonary/Chest: Effort normal.  Abdominal: Soft. There is no tenderness.  Musculoskeletal:  Spontaneously moving all four extremities. Ambulates to bathroom without assistance or antalgic gait.   Psychiatric: Her affect is angry, labile and inappropriate. Her speech is rapid and/or pressured and tangential. She is agitated, aggressive,  hyperactive, actively hallucinating and combative. Thought content is delusional. Cognition and memory are impaired. She expresses impulsivity and inappropriate judgment. She expresses homicidal and suicidal ideation.     ED Treatments / Results  Labs (all labs ordered are listed, but only abnormal results are displayed) Labs Reviewed  COMPREHENSIVE METABOLIC PANEL - Abnormal; Notable for the following components:      Result Value   Potassium 3.0 (*)    CO2 21 (*)    BUN <5 (*)    All other components within normal limits  ACETAMINOPHEN LEVEL - Abnormal; Notable for the following components:   Acetaminophen (Tylenol), Serum <10 (*)    All other components within normal limits  CBC - Abnormal; Notable for the following components:   RBC 3.51 (*)    Hemoglobin 8.5 (*)    HCT 27.1 (*)    MCV 77.2 (*)    MCH 24.2 (*)    Platelets 466 (*)    All other components within normal limits  ETHANOL  SALICYLATE LEVEL  RAPID URINE DRUG SCREEN, HOSP PERFORMED  I-STAT BETA HCG BLOOD, ED ([facility], [facility], [facility] ONLY)    EKG  EKG Interpretation None       Radiology Ct Head Wo Contrast  Result Date: 03/05/2017 CLINICAL DATA:  Altered mental status. EXAM: CT HEAD WITHOUT CONTRAST TECHNIQUE: Contiguous axial images were obtained from the base of the skull through the vertex without intravenous contrast. COMPARISON:  None. FINDINGS: Brain: No evidence of acute infarction, hemorrhage, hydrocephalus, extra-axial collection or mass lesion/mass effect. Vascular: No hyperdense vessel or unexpected calcification. Skull: Normal. Negative for fracture or focal lesion. Sinuses/Orbits: Globes and orbits are unremarkable. Visualized sinuses and mastoid air cells are clear. Other: None. IMPRESSION: Normal unenhanced CT scan of the brain. Electronically Signed   By: Amie Portlandavid  Ormond M.D.   On: 03/05/2017 12:49    Procedures Procedures (including critical care time)  Medications Ordered in ED Medications  potassium  chloride SA (K-DUR,KLOR-CON) CR tablet 40 mEq (40 mEq Oral Not Given 03/05/17 1517)  ziprasidone (GEODON) injection 20 mg (20 mg Intramuscular Given 03/05/17 1145)  LORazepam (ATIVAN) injection 2 mg (2 mg Intravenous Given 03/05/17 1218)     Initial Impression / Assessment and Plan / ED Course  I have reviewed the triage vital signs and the nursing notes.  Pertinent labs & imaging results that were available during my care of the patient were reviewed by me and considered in my medical decision making (see chart for details).  Clinical Course as of Mar 05 1638  Sat Mar 05, 2017  1136 Evaluated patient. She is agitated. Pressured speech. Not cooperating with history. She denies SI, HI or AVH. Will not provide me further history but states she [facility] who will attempt to obtain corroborating history.   [CG]  1210 I spoke with Leonette Mostharles EMT who attempted to get blood work, patient refusing. I personally evaluated patient again, she continues to be agitated, aggressive, with pressured speech.  She is responding to internal stimuli, speaking to somebody named "Chapelle". Discussed purpose of blood work and IVC. She refuses to give us her husband's phone number and I'm unable to obtain corroborating history at this time. We'll give 2 mg Ativan IM in hopes to facilitate medical evaluation.  [CG]  1506 Reevaluated patient. She is asleep and easily arousable. Now cooperating with exam. She denies pain anywhere. Requesting Sprite. She is agreeable with TTS evaluation.  [CG]    Clinical Course User Index [CG] Liberty HandyGibbons, Ladoris Lythgoe J, PA-C   36 year old female appears to be manic. She is reporting suicidal ideations, homicidal ideations and hallucinations. She appears to be responding to internal stimuli, speaking to Qwest CommunicationsDave Chappelle in room. During initial evaluation she reports left hip pain that radiates to left foot for several months. No trauma. She has ambulated and gotten in/out of bed without  difficulty or antalgic gait. Upon repeat exam she denies pain anywhere, requesting food. Low suspicion for emergent etiology causing her RLE/hip pain today. She is medically cleared. Awaiting TTS consult.  TTS recommending inpatient tx.   Final Clinical Impressions(s) / ED Diagnoses   Final diagnoses:  Aggressive behavior    ED Discharge Orders    None       Jerrell MylarGibbons, Nashonda Limberg J, PA-C 03/05/17 1639    Benjiman CorePickering, Nathan, MD 03/06/17 [phone]

## 2017-03-05 NOTE — BH Assessment (Signed)
Tele Assessment Note   Patient Name: Rebecca Lozano MRN: [id number] Referring Physician: Margarette AsalGibbons, GeorgiaPA Location of Patient: [facility] Location of Provider: Behavioral Health TTS Department  Rebecca Lozano is an 36 y.o. female. Pt was not oriented. Pt denies SI/HI/AVH but per Epic notes the Pt stated that she was suicidal. Pt had tangential speech and flight of ideas. Pt was a poor historian. Pt was found by [employer] in the street running in and out of traffic. Pt denies the incident. Pt repeatedly stated that she was pregnant. Per Epic notes the Pt is not pregnant. The Pt states that she is from [place] and that she came to [place] to stay in a shelter. The Pt is currently homeless. Pt denies SA.  Inetta Fermoina, NP recommends inpatient treatment. TTS to seek placement.  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 Bipolar 1 disorder (HCC)   . CHF (congestive heart failure) (HCC)   . GERD (gastroesophageal reflux disease)   . Hypertension   . Seizures (HCC)     History reviewed. No pertinent surgical history.  Family History: No family history on file.  Social History:  reports that she has been smoking cigarettes.  She has been smoking about 0.50 packs per day. she has never used smokeless tobacco. She reports that she does not drink alcohol or use drugs.  Additional Social History:  Alcohol / Drug Use Pain Medications: please see mar Prescriptions: please see mar Over the Counter: please see mar History of alcohol / drug use?: No history of alcohol / drug abuse Longest period of sobriety (when/how long): NA  CIWA:   COWS:    PATIENT STRENGTHS: (choose at least two) Average or above average intelligence Communication skills  Allergies: No Known Allergies  Home Medications:  (Not in a hospital admission)  OB/GYN Status:  No LMP recorded.  General Assessment Data Location of Assessment: [facility] TTS Assessment: In system Is this a Tele or  Face-to-Face Assessment?: Tele Assessment Is this an Initial Assessment or a Re-assessment for this encounter?: Initial Assessment Marital status: Single Maiden name: NA Is patient pregnant?: No Pregnancy Status: No Living Arrangements: Other (Comment)(homeless) Can pt return to current living arrangement?: No Admission Status: Voluntary Is patient capable of signing voluntary admission?: Yes Referral Source: Self/Family/Friend Insurance type: Medicaid     Crisis Care Plan Living Arrangements: Other (Comment)(homeless) Legal Guardian: Other:(self) Name of Psychiatrist: NA Name of Therapist: NA  Education Status Is patient currently in school?: No Current Grade: unknown Highest grade of school patient has completed: unknown Name of school: NA Contact person: NA  Risk to self with the past 6 months Suicidal Ideation: No Has patient been a risk to self within the past 6 months prior to admission? : No Suicidal Intent: No Has patient had any suicidal intent within the past 6 months prior to admission? : No Is patient at risk for suicide?: No Suicidal Plan?: No Has patient had any suicidal plan within the past 6 months prior to admission? : No Access to Means: No What has been your use of drugs/alcohol within the last 12 months?: NA Previous Attempts/Gestures: No How many times?: 0 Other Self Harm Risks: NA Triggers for Past Attempts: None known Intentional Self Injurious Behavior: None Family Suicide History: No Recent stressful life event(s): Other (Comment)("pregnancy") Persecutory voices/beliefs?: No Depression: Yes Depression Symptoms: Tearfulness, Isolating, Guilt, Loss of interest in usual pleasures, Feeling worthless/self pity, Feeling angry/irritable Substance  abuse history and/or treatment for substance abuse?: No Suicide prevention information given to non-admitted patients: Not applicable  Risk to Others within the past 6 months Homicidal Ideation: No Does  patient have any lifetime risk of violence toward others beyond the six months prior to admission? : No Thoughts of Harm to Others: No Current Homicidal Intent: No Current Homicidal Plan: No Access to Homicidal Means: No Identified Victim: NA History of harm to others?: No Assessment of Violence: None Noted Violent Behavior Description: NA Does patient have access to weapons?: No Criminal Charges Pending?: No Does patient have a court date: No Is patient on probation?: No  Psychosis Hallucinations: None noted Delusions: Somatic  Mental Status Report Appearance/Hygiene: Bizarre Eye Contact: Poor Motor Activity: Freedom of movement Speech: Aggressive Level of Consciousness: Alert Mood: Angry Affect: Angry Anxiety Level: Minimal Thought Processes: Tangential, Flight of Ideas Judgement: Impaired Orientation: Place Obsessive Compulsive Thoughts/Behaviors: None  Cognitive Functioning Concentration: Normal Memory: Recent Impaired, Remote Impaired IQ: Average Insight: Poor Impulse Control: Poor Appetite: Fair Weight Loss: 0 Weight Gain: 0 Sleep: Decreased Total Hours of Sleep: 6 Vegetative Symptoms: None  ADLScreening [facility] Assessment Services) Patient's cognitive ability adequate to safely complete daily activities?: Yes Patient able to express need for assistance with ADLs?: Yes Independently performs ADLs?: Yes (appropriate for developmental age)  Prior Inpatient Therapy Prior Inpatient Therapy: No Prior Therapy Dates: Na Prior Therapy Facilty/Provider(s): NA Reason for Treatment: NA  Prior Outpatient Therapy Prior Outpatient Therapy: No Prior Therapy Dates: NA Prior Therapy Facilty/Provider(s): NA Reason for Treatment: NA Does patient have an ACCT team?: No Does patient have Intensive In-House Services?  : No Does patient have [facility] services? : No Does patient have P4CC services?: No  ADL Screening (condition at time of admission) Patient's cognitive  ability adequate to safely complete daily activities?: Yes Is the patient deaf or have difficulty hearing?: No Does the patient have difficulty seeing, even when wearing glasses/contacts?: No Does the patient have difficulty concentrating, remembering, or making decisions?: No Patient able to express need for assistance with ADLs?: Yes Does the patient have difficulty dressing or bathing?: No Independently performs ADLs?: Yes (appropriate for developmental age) Does the patient have difficulty walking or climbing stairs?: No Weakness of Legs: None Weakness of Arms/Hands: None       Abuse/Neglect Assessment (Assessment to be complete while patient is alone) Abuse/Neglect Assessment Can Be Completed: Yes Physical Abuse: Denies Verbal Abuse: Denies Sexual Abuse: Denies Exploitation of patient/patient's resources: Denies     Merchant navy officerAdvance Directives (For Healthcare) Does Patient Have a Medical Advance Directive?: No Would patient like information on creating a medical advance directive?: No - Patient declined    Additional Information 1:1 In Past 12 Months?: No CIRT Risk: No Elopement Risk: No Does patient have medical clearance?: No     Disposition:  Disposition Initial Assessment Completed for this Encounter: Yes Disposition of Patient: Inpatient treatment program Type of inpatient treatment program: Adult  This service was provided via telemedicine using a 2-way, interactive audio and video technology.  Names of all persons participating in this telemedicine service and their role in this encounter. Name: Inetta Fermoina. O Role: NP  Name:  Role:   Name:  Role:   Name:  Role:     Emmit PomfretLevette,Debbe Crumble D 03/05/2017 4:07 PM

## 2017-03-05 NOTE — ED Notes (Signed)
Patient transported to CT 

## 2017-03-05 NOTE — ED Notes (Signed)
Pt becoming more loud and agitated  Dr Adela Lankfloyd in talking to her.  Rapid loud speech arguing with dr Adela Lankfloyd.  She wanted her PACKET updated just 10 minutes ago  She was told registration would talk with her

## 2017-03-05 NOTE — ED Triage Notes (Signed)
[facility] following manic episode in which [employer] was called when patient was out in traffic. States that she is suicidal. Screaming at staff from arrival and Dr. Madilyn Hookees to triage for assessment and med orders. States that she is 4 months pregnant and per previous charts this is negative.

## 2017-03-05 NOTE — ED Notes (Signed)
The pt is happy about her dinner.  She makes no eye contact with me .  She reports that she has no COMPLAINTS ABOUT THIS

## 2017-03-05 NOTE — ED Notes (Signed)
Magistrate advised issuing IVC paperwork.

## 2017-03-05 NOTE — ED Notes (Signed)
gpd here  With ivc papers

## 2017-03-05 NOTE — ED Notes (Signed)
Dinner tray has arrived 

## 2017-03-05 NOTE — ED Notes (Signed)
tts just completed

## 2017-03-06 MED ORDER — LORAZEPAM 1 MG PO TABS
1.0000 mg | ORAL_TABLET | Freq: Three times a day (TID) | ORAL | Status: DC | PRN
Start: 2017-03-06 — End: 2017-03-09
  Administered 2017-03-06 – 2017-03-08 (×5): 1 mg via ORAL
  Filled 2017-03-06 (×5): qty 1

## 2017-03-06 MED ORDER — STERILE WATER FOR INJECTION IJ SOLN
INTRAMUSCULAR | Status: AC
  Administered 2017-03-06: 10 mL
  Filled 2017-03-06: qty 10

## 2017-03-06 MED ORDER — ACETAMINOPHEN 500 MG PO TABS
1000.0000 mg | ORAL_TABLET | Freq: Once | ORAL | Status: AC
  Administered 2017-03-06: 1000 mg via ORAL
  Filled 2017-03-06: qty 2

## 2017-03-06 MED ORDER — LORAZEPAM 2 MG/ML IJ SOLN
2.0000 mg | Freq: Once | INTRAMUSCULAR | Status: AC
  Administered 2017-03-06: 2 mg via INTRAMUSCULAR
  Filled 2017-03-06: qty 1

## 2017-03-06 MED ORDER — OLANZAPINE 10 MG IM SOLR
10.0000 mg | Freq: Once | INTRAMUSCULAR | Status: AC | PRN
  Administered 2017-03-06: 10 mg via INTRAMUSCULAR
  Filled 2017-03-06 (×2): qty 10

## 2017-03-06 MED ORDER — ZIPRASIDONE MESYLATE 20 MG IM SOLR
20.0000 mg | Freq: Once | INTRAMUSCULAR | Status: AC
  Administered 2017-03-06: 20 mg via INTRAMUSCULAR
  Filled 2017-03-06: qty 20

## 2017-03-06 MED ORDER — OLANZAPINE 5 MG PO TBDP
5.0000 mg | ORAL_TABLET | Freq: Three times a day (TID) | ORAL | Status: DC | PRN
  Filled 2017-03-06 (×4): qty 1

## 2017-03-06 MED ORDER — POTASSIUM CHLORIDE CRYS ER 20 MEQ PO TBCR
40.0000 meq | EXTENDED_RELEASE_TABLET | Freq: Once | ORAL | Status: AC
  Administered 2017-03-06: 40 meq via ORAL
  Filled 2017-03-06: qty 2

## 2017-03-06 MED ORDER — STERILE WATER FOR INJECTION IJ SOLN
INTRAMUSCULAR | Status: AC
  Administered 2017-03-07: 21:00:00
  Filled 2017-03-06: qty 10

## 2017-03-06 NOTE — ED Triage Notes (Signed)
PT standing in door agitated and shouting. Pt demanded a copy of  All lab work . Pt continued to speak in a loud rapid voice while pacing in room.

## 2017-03-06 NOTE — ED Notes (Signed)
Inetta Fermoina, NP, [facility] - advised she received request to perform Telepsych. States she will order Medication Recommendations but will not be performing Telepsych.

## 2017-03-06 NOTE — ED Notes (Signed)
In[facility] Ferm[facility]ina, NP, [facility], aware pt refusing po Zyprexa. Inetta Fermoina spoke w/Dr Jodi MourningZavitz - order placed for Zyprexa IM.

## 2017-03-06 NOTE — ED Notes (Signed)
Pt making phone call at desk at this time  

## 2017-03-06 NOTE — ED Provider Notes (Signed)
Patient having worsening agitation despite being max on geodone.   Psychiatry recommends one dose of 10 IM zyprexa.   Plan for further observation pending psychiatric placement.  Kenton KingfisherJoshua M Raeleen Winstanley    Grey Schlauch, MD 03/06/17 [phone]

## 2017-03-06 NOTE — ED Notes (Signed)
Dinner tray ordered; regular diet 

## 2017-03-06 NOTE — ED Notes (Signed)
Pt provided with crayons and paper.  Chaplain made aware of patient's request for a bible

## 2017-03-06 NOTE — ED Triage Notes (Signed)
PT continued to speak in a rapid stream of words.

## 2017-03-06 NOTE — ED Provider Notes (Addendum)
3:00 AM  Asked by nursing staff to reevaluate patient as she is complaining of left hip pain.  Patient states she has had this pain since November 5.  No recent injury.  She is able to ambulate.  She is here for mania, suicidality.  There is no sign of septic arthritis, joint effusion on exam.  No leg length discrepancy.  Extremities are warm and well perfused with normal sensation.  No bony abnormality.  I do not feel she has a fracture at this time.  I do not feel she needs emergent x-rays.  Will give dose of Tylenol for her pain.  Patient ambulates without difficulty.   Ward, Layla MawKristen N, DO 03/06/17 0301   7:30 AM  Pt becoming increasingly agitated.  She is requesting paperwork that she states she had with her results on it and list of her belongings.  She is accusing staff of throwing it away.  She also accused the sitter of sleeping with her significant other.  She is now talking to people that are not present in the room.  Given her increasing agitation, yelling, IM Geodon will be given.  TTS has recommended inpatient psychiatric treatment.  Placement pending.   Ward, Layla MawKristen N, DO 03/06/17 [phone]

## 2017-03-06 NOTE — ED Triage Notes (Signed)
PT walking in room back and forth and speaking in a rapid , loud voice.

## 2017-03-06 NOTE — ED Notes (Signed)
Carney BernJean, [facility], SW, advised will ask NP, [facility], for med recommendations d/t pt's inappropriate behavior.

## 2017-03-06 NOTE — ED Notes (Signed)
Pt up in room making bed requesting copies of belongings sheet and lab results.  Pt has lab results already printed prior to this nurses shift.  Explanation of results given to patient.

## 2017-03-06 NOTE — ED Notes (Signed)
Rebecca NipEugene, [facility] SW, advised will request for NP, [facility], to telepsych pt.

## 2017-03-06 NOTE — ED Notes (Signed)
Pt noted to be up in room walking in room.  Requesting sanitary pad and mesh underwear.  Provided to patient.

## 2017-03-06 NOTE — ED Triage Notes (Signed)
Pt now standing at door reading reports she was given by EDP in a   loud rapid voice.

## 2017-03-06 NOTE — ED Notes (Signed)
Lunch ordered 

## 2017-03-07 MED ORDER — ACETAMINOPHEN 325 MG PO TABS
650.0000 mg | ORAL_TABLET | Freq: Once | ORAL | Status: AC
  Administered 2017-03-07: 650 mg via ORAL
  Filled 2017-03-07: qty 2

## 2017-03-07 MED ORDER — STERILE WATER FOR INJECTION IJ SOLN
INTRAMUSCULAR | Status: AC
  Administered 2017-03-07: 1 mL
  Filled 2017-03-07: qty 10

## 2017-03-07 MED ORDER — ZIPRASIDONE MESYLATE 20 MG IM SOLR
20.0000 mg | Freq: Once | INTRAMUSCULAR | Status: AC
  Administered 2017-03-07: 20 mg via INTRAMUSCULAR
  Filled 2017-03-07: qty 20

## 2017-03-07 MED ORDER — STERILE WATER FOR INJECTION IJ SOLN
INTRAMUSCULAR | Status: AC
  Administered 2017-03-07: 21:00:00
  Filled 2017-03-07: qty 10

## 2017-03-07 MED ORDER — LORAZEPAM 2 MG/ML IJ SOLN
2.0000 mg | Freq: Once | INTRAMUSCULAR | Status: AC
  Administered 2017-03-07: 2 mg via INTRAMUSCULAR
  Filled 2017-03-07: qty 1

## 2017-03-07 NOTE — ED Notes (Signed)
Pt attempted to use the phone but did not speak with anyone

## 2017-03-07 NOTE — ED Notes (Signed)
Pt woke up, started shuffling around in room and shut door. This RN went to open door back up and explained to pt that she couldn't close door due to worrying about her safety. Pt sts she needs privacy to get pads and panties, told pt I would ask someone to get some as soon as the tech came back from break, pt got irrate started yelling and getting in this RN's

## 2017-03-07 NOTE — Progress Notes (Addendum)
LCSW following for placement of patient: inpatient psychiatric admission  [facility], needs 500 hall bed no roommate [place]: no beds.  10:58 AM [facility] currently for possible [facility]. Working on Investment banker, corporateverifying insurance.  OV will call back. RN was called however unable to assist with insurance information.   Declined due to out of network benefit from [facility].  Deretha EmoryHannah Keidan Aumiller LCSW, MSW Clinical Social Work: Optician, dispensingystem [employer] [place] :

## 2017-03-07 NOTE — ED Notes (Signed)
Pt refused to take zyprexa stating she has a reaction to it, pt unable to tell RN what kind of reaction when asked but pt continues to refuse and states "I will take geodon but im not taking zyprexa" MD Fayrene FearingJames aware, new order for geodon  In process

## 2017-03-07 NOTE — ED Notes (Signed)
Pt moved from room [facility] to [id number] for TTS. Pt transported on bed to new room. Pt became irate upon arriving to pod F, security notified and now present in pod. Pt now crying in room. New blankets given to pt.

## 2017-03-07 NOTE — ED Notes (Signed)
Patient changed from scrubs to hospital gown and continues to attempt to shut door to be alone.

## 2017-03-07 NOTE — ED Notes (Signed)
Pt ambulated to restroom with sitter. 

## 2017-03-07 NOTE — ED Notes (Signed)
Patient changed back into scrubs and all gowns removed from room. Patient still refuses to have staff in her room, but has laid back down in her bed.

## 2017-03-07 NOTE — ED Notes (Signed)
Pt continues to sit in room, talking with word salad, flight of ideas, pt getting loud at times but able to be told to quiet down.

## 2017-03-07 NOTE — ED Notes (Signed)
Patient speaking to voices. Stating that "all these women sleeping with my husband". Patient refusing to allow this sitter to be in the room with her at this time. Patient OOB to bathroom and now back in room. Patient is now rummaging through belongings in room as well as attempting to look through drawers. Patient is very restless, agitated, and continues to be stimulated by voices that she is hearing. RN notified of patients behavior.

## 2017-03-07 NOTE — ED Notes (Signed)
Pt becoming agitated after sitter and RN informed pt she cannot close her door and it must remain open for safety purposes.

## 2017-03-07 NOTE — ED Notes (Signed)
Pt walked out of room, walking around Pod F looking at all the exits, asking about what pod she is in and how to get out "in case there is a fire". Pt asked to return to her room but pt then got loud and cussing at staff stating "you dont tell me what I need to do" pt then demanded a sandwich, crackers and drink.

## 2017-03-07 NOTE — ED Notes (Signed)
Pt ambulatory to the restroom, continues to curse at staff. Pt returned to room with lights off, lying in bed

## 2017-03-07 NOTE — [facility] Counselor (Signed)
Re-assessment:   Patient denies SI, HI, AVH.  Patient speech in tangential speech with flight of ideas. Patient reporting she's pregnant however test results are negative.   Patient denies SI, HI, AVH. Patient presents with disorganized thought, delusional and paranoia.   Per Inetta Fermoina, NP, patient continues to meet inpatient criteria.

## 2017-03-07 NOTE — ED Notes (Signed)
Pt being escorted by sitter and security to shower.

## 2017-03-07 NOTE — ED Triage Notes (Signed)
PT in door way shouting at sitter ,agitated.

## 2017-03-07 NOTE — ED Notes (Signed)
Pt asking to take shower; housekeeping en route to clean shower before pt is escorted by sitter.

## 2017-03-07 NOTE — ED Notes (Signed)
Pt.was asking staff for maxipads she stated that her period was on.assit her to bathroom to clean up.she stayed in bathroom for quit sometime .I ASK TO COME OUT .

## 2017-03-07 NOTE — ED Notes (Signed)
Pt asking for tylenol for headache. EDP notified.  

## 2017-03-07 NOTE — ED Notes (Addendum)
Pt attempted to make phone call at desk.

## 2017-03-07 NOTE — ED Notes (Signed)
Pt changed her mind about showering at this time.

## 2017-03-07 NOTE — ED Notes (Signed)
Pt given bag lunch and sprite.  

## 2017-03-08 LAB — CBC
HEMATOCRIT: 29 % — AB (ref 36.0–46.0)
Hemoglobin: 9 g/dL — ABNORMAL LOW (ref 12.0–15.0)
MCH: 24.3 pg — ABNORMAL LOW (ref 26.0–34.0)
MCHC: 31 g/dL (ref 30.0–36.0)
MCV: 78.4 fL (ref 78.0–100.0)
PLATELETS: 451 10*3/uL — AB (ref 150–400)
RBC: 3.7 MIL/uL — AB (ref 3.87–5.11)
RDW: 15.1 % (ref 11.5–15.5)
WBC: 6 10*3/uL (ref 4.0–10.5)

## 2017-03-08 LAB — POTASSIUM: Potassium: 3.9 mmol/L (ref 3.5–5.1)

## 2017-03-08 MED ORDER — ZIPRASIDONE MESYLATE 20 MG IM SOLR
20.0000 mg | Freq: Once | INTRAMUSCULAR | Status: AC
  Administered 2017-03-08: 20 mg via INTRAMUSCULAR
  Filled 2017-03-08: qty 20

## 2017-03-08 MED ORDER — ACETAMINOPHEN 325 MG PO TABS
650.0000 mg | ORAL_TABLET | Freq: Four times a day (QID) | ORAL | Status: DC | PRN
  Administered 2017-03-08 (×3): 650 mg via ORAL
  Filled 2017-03-08 (×3): qty 2

## 2017-03-08 MED ORDER — ZIPRASIDONE MESYLATE 20 MG IM SOLR
10.0000 mg | Freq: Once | INTRAMUSCULAR | Status: DC
  Filled 2017-03-08: qty 20

## 2017-03-08 MED ORDER — STERILE WATER FOR INJECTION IJ SOLN
INTRAMUSCULAR | Status: AC
  Filled 2017-03-08: qty 10

## 2017-03-08 NOTE — ED Notes (Signed)
Regular Diet ordered for Dinner. The Patient was given a cup Ginger Ale.

## 2017-03-08 NOTE — ED Notes (Signed)
Pt awake, ambulatory to restroom, pt c.o flank pain, requesting sandwich and coke, along with tylenol to help with pain. Pt given food and order for tylenol requested to EDP. Pt calm and cooperative

## 2017-03-08 NOTE — ED Notes (Signed)
Breakfast tray delivered

## 2017-03-08 NOTE — ED Notes (Signed)
Pt talking to herself. Pt is still calm not yelling or fighting with any of the staff.

## 2017-03-08 NOTE — ED Notes (Signed)
Pt sitting in bed with lights on, sitter remains at bedside

## 2017-03-08 NOTE — ED Notes (Signed)
Pt refusing to let geodon injection be given anywhere but right deltiod. On administration of medication pt requesting administration in SQ tissue. I explained to pt this medication is to be given IM and has to be given in certain landmark. PT screaming and cursing at this RN. Pt screams "get the fuck out of this room you cracker bitch. You only want to give it there because you know it hurts worse. Vacate the room bitch." Pt drew fist back at this nurse at that time but did not hit. Security at bedside

## 2017-03-08 NOTE — ED Provider Notes (Addendum)
9 AM patient standing up yelling, cursing combative medicated with oral Ativan and IM Geodon ordered by Dr. Ranae PalmsYelverton. 9:20 AM sitting in bed.  Much less agitated   Rebecca Lozano, Rebecca Kyser, MD 03/08/17 [phone] Of note patient's hemoglobin is stable and potassium has normalized Results for orders placed or performed during the hospital encounter of 03/05/17  Comprehensive metabolic panel  Result Value Ref Range   Sodium 135 135 - 145 mmol/L   Potassium 3.0 (L) 3.5 - 5.1 mmol/L   Chloride 104 101 - 111 mmol/L   CO2 21 (L) 22 - 32 mmol/L   Glucose, Bld 89 65 - 99 mg/dL   BUN <5 (L) 6 - 20 mg/dL   Creatinine, Ser 6.570.66 0.44 - 1.00 mg/dL   Calcium 9.1 8.9 - 84.610.3 mg/dL   Total Protein 7.7 6.5 - 8.1 g/dL   Albumin 4.1 3.5 - 5.0 g/dL   AST 29 15 - 41 U/L   ALT 17 14 - 54 U/L   Alkaline Phosphatase 53 38 - 126 U/L   Total Bilirubin 1.0 0.3 - 1.2 mg/dL   GFR calc non Af Amer >60 >60 mL/min   GFR calc Af Amer >60 >60 mL/min   Anion gap 10 5 - 15  Ethanol  Result Value Ref Range   Alcohol, Ethyl (B) <10 <10 mg/dL  Salicylate level  Result Value Ref Range   Salicylate Lvl <7.0 2.8 - 30.0 mg/dL  Acetaminophen level  Result Value Ref Range   Acetaminophen (Tylenol), Serum <10 (L) 10 - 30 ug/mL  cbc  Result Value Ref Range   WBC 5.0 4.0 - 10.5 K/uL   RBC 3.51 (L) 3.87 - 5.11 MIL/uL   Hemoglobin 8.5 (L) 12.0 - 15.0 g/dL   HCT 96.227.1 (L) 95.236.0 - 84.146.0 %   MCV 77.2 (L) 78.0 - 100.0 fL   MCH 24.2 (L) 26.0 - 34.0 pg   MCHC 31.4 30.0 - 36.0 g/dL   RDW 32.414.9 40.111.5 - 02.715.5 %   Platelets 466 (H) 150 - 400 K/uL  Rapid urine drug screen (hospital performed)  Result Value Ref Range   Opiates NONE DETECTED NONE DETECTED   Cocaine NONE DETECTED NONE DETECTED   Benzodiazepines NONE DETECTED NONE DETECTED   Amphetamines NONE DETECTED NONE DETECTED   Tetrahydrocannabinol NONE DETECTED NONE DETECTED   Barbiturates NONE DETECTED NONE DETECTED  Potassium  Result Value Ref Range   Potassium 3.9 3.5 - 5.1 mmol/L   CBC  Result Value Ref Range   WBC 6.0 4.0 - 10.5 K/uL   RBC 3.70 (L) 3.87 - 5.11 MIL/uL   Hemoglobin 9.0 (L) 12.0 - 15.0 g/dL   HCT 25.329.0 (L) 66.436.0 - 40.346.0 %   MCV 78.4 78.0 - 100.0 fL   MCH 24.3 (L) 26.0 - 34.0 pg   MCHC 31.0 30.0 - 36.0 g/dL   RDW 47.415.1 25.911.5 - 56.315.5 %   Platelets 451 (H) 150 - 400 K/uL  I-Stat beta hCG blood, ED  Result Value Ref Range   I-stat hCG, quantitative <5.0 <5 mIU/mL   Comment 3           Ct Head Wo Contrast  Result Date: 03/05/2017 CLINICAL DATA:  Altered mental status. EXAM: CT HEAD WITHOUT CONTRAST TECHNIQUE: Contiguous axial images were obtained from the base of the skull through the vertex without intravenous contrast. COMPARISON:  None. FINDINGS: Brain: No evidence of acute infarction, hemorrhage, hydrocephalus, extra-axial collection or mass lesion/mass effect. Vascular: No hyperdense vessel or unexpected calcification. Skull: Normal.  Negative for fracture or focal lesion. Sinuses/Orbits: Globes and orbits are unremarkable. Visualized sinuses and mastoid air cells are clear. Other: None. IMPRESSION: Normal unenhanced CT scan of the brain. Electronically Signed   By: Amie Portlandavid  Ormond M.D.   On: 03/05/2017 12:49     Rebecca Lozano, Rebecca Allor, MD 03/08/17 1556

## 2017-03-08 NOTE — ED Notes (Addendum)
Pt became very upset and started to yell at the sitter for sitting and watching her. RN informed the pt that the sitter his a requirement because pt is IVC. Pt was explained that she cannot yell at the sitters and pt states that she will just pray and won't yell at the sitter no more.

## 2017-03-08 NOTE — ED Notes (Signed)
Pt ambulatory to the restroom.  

## 2017-03-08 NOTE — ED Notes (Signed)
Pt ambulatory to the restroom, rn provided with cranberry juice per request

## 2017-03-08 NOTE — ED Notes (Signed)
Patient stated 2 minutes prior that she would take the oral ativan, when giving to patient she states she will not take medication and to leave her alone. Instructed that if patient will stop yelling and cussing at staff we will leave her alone to rest. Wasted ativan with Estil DaftElizabeth Poulofe

## 2017-03-08 NOTE — ED Notes (Signed)
Attempted to get vital signs patient stated "get out, I do not like people like you. You are too nice. I will get your license removed. You will have no job." Pt in view with sitter

## 2017-03-08 NOTE — B[facility] Counselor (Addendum)
Reassessed pt who is very upset and [place] go [place]. Pt very argumentative, and has tangential thinking with flight of ideas, continues to state that Geodon has been d/c'd. She continues to talk about [facility] and asking to be taken to [facility]--pt is a poor historian. Pt stating that her human rights are being violated by her being given Geodon. She states that she was hopeless when she came in, and she denies current SI, HI, AVH. She states that she has a bus and [employer]orticultur[employer] ticket to [place] and is ready to go. Writer attempted to explain to pt re: IVC and recommendation for IP treatment, encouraging cooperation, but pt continued to interrupt and argue, stating that she could die with this medication and she may as well die outside where she was in the cold. Pt seems to have little insight into her situation.  TTS is continuing to seek placement.

## 2017-03-08 NOTE — ED Notes (Signed)
Pt awake, ambulatory to restroom, started to yell and curse at sitter. Security called to bedside, pt told to lower voice and to calm down. Pt calm after a few minutes.

## 2017-03-08 NOTE — ED Notes (Addendum)
Pt ambulated to the restroom with steady gait talking to herself at this time.

## 2017-03-08 NOTE — ED Notes (Signed)
Pt much more calmer now. Sitting on the bed not saying anything at this time.

## 2017-03-08 NOTE — ED Notes (Signed)
Pt ambulatory to the restroom, pt given new scrubs upon request, pt returned to room.

## 2017-03-08 NOTE — ED Notes (Signed)
Pt ambulatory to restroom

## 2017-03-08 NOTE — ED Notes (Signed)
Pt alert, talking to herself in room, delusional thoughts

## 2017-03-08 NOTE — ED Notes (Signed)
Pt in restroom talking to herself about stocking stuffers. PT denies auditory/ visual hallucinations

## 2017-03-09 ENCOUNTER — Inpatient Hospital Stay (HOSPITAL_COMMUNITY)
Admission: AD | Admit: 2017-03-09 | Discharge: 2017-03-23 | DRG: 885 | Disposition: A | Discharge status: Home / Self Care | Payer: Federal, State, Local not specified - Other | Source: Intra-hospital | Attending: Psychiatry | Admitting: Psychiatry

## 2017-03-09 DIAGNOSIS — Z79899 Other long term (current) drug therapy: Secondary | ICD-10-CM | POA: Diagnosis not present

## 2017-03-09 DIAGNOSIS — R569 Unspecified convulsions: Secondary | ICD-10-CM | POA: Diagnosis present

## 2017-03-09 DIAGNOSIS — F419 Anxiety disorder, unspecified: Secondary | ICD-10-CM | POA: Diagnosis present

## 2017-03-09 DIAGNOSIS — F312 Bipolar disorder, current episode manic severe with psychotic features: Secondary | ICD-10-CM | POA: Diagnosis not present

## 2017-03-09 DIAGNOSIS — I110 Hypertensive heart disease with heart failure: Secondary | ICD-10-CM | POA: Diagnosis present

## 2017-03-09 DIAGNOSIS — Z880 Allergy status to penicillin: Secondary | ICD-10-CM | POA: Diagnosis not present

## 2017-03-09 DIAGNOSIS — I509 Heart failure, unspecified: Secondary | ICD-10-CM | POA: Diagnosis present

## 2017-03-09 DIAGNOSIS — F17210 Nicotine dependence, cigarettes, uncomplicated: Secondary | ICD-10-CM | POA: Diagnosis not present

## 2017-03-09 DIAGNOSIS — I252 Old myocardial infarction: Secondary | ICD-10-CM

## 2017-03-09 DIAGNOSIS — F29 Unspecified psychosis not due to a substance or known physiological condition: Secondary | ICD-10-CM | POA: Diagnosis present

## 2017-03-09 DIAGNOSIS — Z590 Homelessness: Secondary | ICD-10-CM | POA: Diagnosis not present

## 2017-03-09 DIAGNOSIS — G4700 Insomnia, unspecified: Secondary | ICD-10-CM | POA: Diagnosis not present

## 2017-03-09 DIAGNOSIS — R450 Nervousness: Secondary | ICD-10-CM | POA: Diagnosis not present

## 2017-03-09 MED ORDER — OLANZAPINE 5 MG PO TBDP
5.0000 mg | ORAL_TABLET | Freq: Three times a day (TID) | ORAL | Status: DC | PRN
  Filled 2017-03-09 (×2): qty 1

## 2017-03-09 MED ORDER — ZIPRASIDONE MESYLATE 20 MG IM SOLR: 20.0000 mg | INTRAMUSCULAR | Status: DC | PRN

## 2017-03-09 MED ORDER — TRAZODONE HCL 50 MG PO TABS
50.0000 mg | ORAL_TABLET | Freq: Every evening | ORAL | Status: DC | PRN
Start: 2017-03-09 — End: 2017-03-23
  Filled 2017-03-09 (×33): qty 1

## 2017-03-09 MED ORDER — HYDROXYZINE HCL 25 MG PO TABS: 25.0000 mg | ORAL_TABLET | Freq: Four times a day (QID) | ORAL | Status: DC | PRN

## 2017-03-09 MED ORDER — ARIPIPRAZOLE 5 MG PO TABS
5.0000 mg | ORAL_TABLET | Freq: Every day | ORAL | Status: DC
  Filled 2017-03-09 (×2): qty 1

## 2017-03-09 MED ORDER — LORAZEPAM 1 MG PO TABS
1.0000 mg | ORAL_TABLET | ORAL | Status: DC | PRN
  Filled 2017-03-09 (×2): qty 1

## 2017-03-09 NOTE — ED Notes (Signed)
Pt offered medicxation for anxiety. Pt refuses and states"I'm fine."

## 2017-03-09 NOTE — ED Provider Notes (Signed)
8:52 AM Asked to see patient by RN.  Patient appears mildly agitated when questioned but is otherwise eating her breakfast without difficulty.  She is refusing p.o. medications.  However she is otherwise not acting out or becoming violent/aggressive.  I do not think IM medications are needed at this time but she probably need psychiatry to reassess her to help treat her psychiatric disease.  RN will call psych.   [name], [name], MD 03/09/17 [phone]

## 2017-03-09 NOTE — ED Notes (Signed)
Pt continues to talk to herself in the room.

## 2017-03-09 NOTE — ED Provider Notes (Signed)
Sitting in bed.  Appears comfortable.  .  No distress   Rebecca SouJacubowitz, Rebecca Massoud, MD 03/09/17 [phone]

## 2017-03-09 NOTE — ED Notes (Signed)
Meal tray delivered.

## 2017-03-09 NOTE — ED Notes (Signed)
Pt ambulated back to the room and is allowing to take vitals at this time

## 2017-03-09 NOTE — ED Notes (Signed)
Pt ambulated to the shower room with the sitter without any problems

## 2017-03-09 NOTE — Progress Notes (Signed)
Patient ID: Rebecca Lozano, female   DOB: Aug 24, 1980, 36 y.o.   MRN: [id number] Per State regulations 482.30 this chart was reviewed for medical necessity with respect to the patient's admission/duration of stay.    Next review date: 03/11/17  Thurman CoyerEric Jessabelle Markiewicz, BSN, RN-BC  Case Manager

## 2017-03-09 NOTE — ED Notes (Signed)
Pt oob to bathroom with steady gait. 

## 2017-03-09 NOTE — Progress Notes (Signed)
[facility]  [facility] Bed 1 Rebecca Rankin, NP, is the accepting provider.  Dr. Altamese Lozano is the attending provider.  Call report to [phone]  [facility] notified.   Pt is IVC  Pt may be transported by [employer] Pt scheduled  [facility].   Rebecca BernJean T. Kaylyn LimSutter, Rebecca Lozano, Rebecca Lozano Disposition Clinical Social Work [phone] (cell) [phone] (office)

## 2017-03-09 NOTE — ED Notes (Signed)
[employer] arrived to [facility]. Pt was getting agitated because she could not have possession of her belongings. [employer] escorted pt to their vehicle by wheelchair and tech brought belongings out to [employer]'s vehicle.

## 2017-03-09 NOTE — ED Notes (Signed)
Pt remains talking to hallucination at the bedside. Pt loud and refuses to take any PO medications at this time.

## 2017-03-09 NOTE — ED Notes (Addendum)
Spoke with social to prioritize the patient for dispo.

## 2017-03-09 NOTE — ED Notes (Signed)
Pt talking to herself at this time. Pt still in bed not bothering anybody.

## 2017-03-09 NOTE — ED Notes (Signed)
Attempted to enter pt room and she shouted at me and requested I get out of her room. Pt states she wants to eat first. Not eating. Sitting at bedside talking.

## 2017-03-09 NOTE — [facility] Counselor (Signed)
[facility] in a disoriented state..  Per report, she was found by [employer] in the street running in and out of traffic.  Pt was reassessed today.  Pt was sitting upright and eating. Demeanor was irritable and guarded.  When asked why Pt was at the hospital, she stated, "I do not know."  She denied suicidal ideation, homicidal ideation.  When asked if she is pregnant, Pt stated that she was not sure.  Pt became tangential and increasingly expansive in speech, stating that she had to throw things away because they were too heavy, that she had a bus ticket to go to [place], and that she is engaged (then described herself as married), but that she is not allowed to give the name or contact information of her fiance.  Recommend continued inpatient care.

## 2017-03-09 NOTE — ED Notes (Signed)
Pt sitting in bed with telepsych at bedside. Request lights on and same done

## 2017-03-10 ENCOUNTER — Other Ambulatory Visit: Payer: Self-pay

## 2017-03-10 ENCOUNTER — Encounter (HOSPITAL_COMMUNITY): Payer: Self-pay | Admitting: *Deleted

## 2017-03-10 DIAGNOSIS — F312 Bipolar disorder, current episode manic severe with psychotic features: Principal | ICD-10-CM

## 2017-03-10 DIAGNOSIS — F29 Unspecified psychosis not due to a substance or known physiological condition: Secondary | ICD-10-CM

## 2017-03-10 DIAGNOSIS — F17210 Nicotine dependence, cigarettes, uncomplicated: Secondary | ICD-10-CM

## 2017-03-10 MED ORDER — ADULT MULTIVITAMIN W/MINERALS CH
1.0000 | ORAL_TABLET | Freq: Every day | ORAL | Status: DC
  Administered 2017-03-14 – 2017-03-23 (×10): 1 via ORAL
  Filled 2017-03-10 (×17): qty 1

## 2017-03-10 MED ORDER — DIPHENHYDRAMINE HCL 50 MG PO CAPS
50.0000 mg | ORAL_CAPSULE | Freq: Once | ORAL | Status: DC
  Filled 2017-03-10: qty 2
  Filled 2017-03-10: qty 1

## 2017-03-10 MED ORDER — CALCIUM CARBONATE ANTACID 500 MG PO CHEW
1.0000 | CHEWABLE_TABLET | Freq: Two times a day (BID) | ORAL | Status: DC | PRN
  Administered 2017-03-13 – 2017-03-23 (×17): 200 mg via ORAL
  Filled 2017-03-10 (×16): qty 1

## 2017-03-10 MED ORDER — GUAIFENESIN ER 600 MG PO TB12
600.0000 mg | ORAL_TABLET | Freq: Two times a day (BID) | ORAL | Status: DC
  Administered 2017-03-12 – 2017-03-23 (×20): 600 mg via ORAL
  Filled 2017-03-10 (×30): qty 1

## 2017-03-10 MED ORDER — ACETAMINOPHEN 325 MG PO TABS
650.0000 mg | ORAL_TABLET | Freq: Four times a day (QID) | ORAL | Status: DC | PRN
  Administered 2017-03-10 – 2017-03-19 (×13): 650 mg via ORAL
  Filled 2017-03-10 (×15): qty 2

## 2017-03-10 NOTE — Progress Notes (Signed)
Patient has been agitated, rude and disruptive.   Patient is floridly hallucinating talking to unseen other in an aggressive manner.  Patient has refused all antipsychotic medications, but continues to to talk to unseen others throughout the shift.  When attempting to confront patient about hallucinations, patient becomes defensive and denies that she is has been talking out loud to anyone.  Patient is angry and agitated using verbally abusive to peers.  Patient needs much redirection.   Assess patient for safety,  Offer medications as prescribed, engage patient in 1:1 staff talks.   Continue to monitor as planned.  Patient able to contract for safety.
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
## 2017-03-10 NOTE — Progress Notes (Signed)
Pt spoke fast and loudly during the admission process. Pt appeared to be irritable and angry for the first several minutes, explaining to the writer how "displeased" she was with the admission nurse because he called her by her "first name". Writer explained that in the hosp, we call all pt's by their first name and that the nurse wasn't attempting to disrespect her.  Per report pt was found by [employer] running in and out of traffic. Pt didn't deny or confirm the report given. Pt wouldn't continue with the process unless the writer allowed her to review the information that was printed by the adm nurse. Writer allowed pt to review info. Pt reluctantly agreed to do most of the process.  Pt denied SI, HI, and A/V. Once in her room pt informed her roommate, "not to talk to her". Stated, "I'm not here to make friends".  After the introduction of her roommate, pt followed the writer to the nurses station and stated, "I'm not mean, I just like to stay to myself".

## 2017-03-10 NOTE — Progress Notes (Signed)
Recreation Therapy Notes  INPATIENT RECREATION THERAPY ASSESSMENT  Patient Details Name: Rebecca Lozano MRN: [id number] DOB: [birth year] Today's Date: 03/10/2017  Patient Stressors: Other (Comment)(Homeless, lack of support, judgemental people)  Pt stated she was here because she is homeless.  Coping Skills:   Exercise, Art/Dance, Talking, Music, Other (Comment)(Meditating, AA meetings)  Pt stated she likes to walk and write.  Personal Challenges: Anger, Self-Esteem/Confidence  Leisure Interests (2+):  Individual - Writing, Individual - Reading, Social - Family, Community - Travel (Comment), Individual - Other (Comment)(Cook, integrity)  Awareness of Community Resources:  Yes  Community Resources:  Engineering geologistLibrary, Other (Comment)([employer], [employer], [employer])  Current Use: Yes  Patient Strengths:  Organized; Neat and Clean  Patient Identified Areas of Improvement:  Tolerance of others  Current Recreation Participation:  Everyday  Patient Goal for Hospitalization:  "Peace at home, make contact with SSA and social worker here"  [place]  [place] Residence[place]  Current [place] (including self-harm):  No  Current HI:  No  Consent to Intern Participation: N/A    Caroll RancherMarjette Rayen Dafoe, LRT/CTRS  Caroll RancherLindsay, Darbi Chandran A 03/10/2017, 11:59 AM

## 2017-03-10 NOTE — BHH Counselor (Signed)
CSW attempted to complete PSA. Pt refused. CSW will attempt again at a later time.   Daisy FloroCandace L Hoyle Barkdull MSW, LCSW  03/10/2017 3:23 PM

## 2017-03-10 NOTE — Progress Notes (Signed)
Recreation Therapy Notes    Date: 03/10/17 Time: 0950 Location: 500 Hall Dayroom  Group Topic: Wellness  Goal Area(s) Addresses:  Patient will define components of whole wellness. Patient will verbalize benefit of whole wellness.  Intervention: ArchivistChairs, meditation script, music  Activity: Chair Exercises, Guided Meditation and Dancing.  LRT led patients through some chair exercises.  LRT also led patients through a short meditation before finishing with some dancing.  Education: Wellness, Building control surveyorDischarge Planning.   Education Outcome: Acknowledges education/In group clarification offered/Needs additional education.   Clinical Observations/Feedback: Pt did not attend group.    Caroll RancherMarjette Davarius Ridener, LRT/CTRS   Caroll RancherLindsay, Harold Mattes A 03/10/2017 11:37 AM

## 2017-03-11 MED ORDER — LORAZEPAM 2 MG/ML IJ SOLN: 2.0000 mg | Freq: Four times a day (QID) | INTRAMUSCULAR | Status: DC | PRN

## 2017-03-11 MED ORDER — OLANZAPINE 10 MG PO TBDP
10.0000 mg | ORAL_TABLET | Freq: Every day | ORAL | Status: DC
  Administered 2017-03-11: 10 mg via ORAL
  Filled 2017-03-11 (×4): qty 1

## 2017-03-11 MED ORDER — DIPHENHYDRAMINE HCL 25 MG PO CAPS: 50.0000 mg | ORAL_CAPSULE | Freq: Three times a day (TID) | ORAL | Status: DC | PRN

## 2017-03-11 MED ORDER — OLANZAPINE 10 MG IM SOLR
10.0000 mg | Freq: Every day | INTRAMUSCULAR | Status: DC
  Filled 2017-03-11 (×4): qty 10

## 2017-03-11 NOTE — BHH Counselor (Signed)
CSW attempted PSA. Pt refused to participate.   Daisy FloroCandace L Harshal Sirmon MSW, LCSW  03/11/2017 4:07 PM

## 2017-03-11 NOTE — Progress Notes (Signed)
Pt was polite but haughty when refusing scheduled multivitamin and Mucinex this a.m. She has presented no milieu issues so far this shift.

## 2017-03-11 NOTE — Progress Notes (Signed)
D: Pt has refused any attempt at medication administration today. She also has been angry and verbally aggressive during the rare time she has left her room. When staff open her door to do 15-minute checks, she has often begun yelling loudly and belligerently, continuing to do so well after the staff member has closed the door and walked down the hall. She yells over staff members' attempts to politely discuss unit policies with her.    A: Pt has refused all meds. Q15 safety checks maintained. Attempts at support/encouragement rebuffed.  R: Pt remains free from harm and continues with treatment. Will continue to monitor for needs/safety.

## 2017-03-11 NOTE — BH Specialist Note (Addendum)
Engaged pt while she was in her room to invite her to participate in Chaplain's afternoon group. Pt observed standing at window looking out with her back to door - stated all in one statement flow - "I don't want to go - I heard already - I don't want to talk. I don't feel good - I don't want anything". At this point, she walked to her door and closed the bedroom door. Minimal eye contact. Affect dull, angry. Informed nurse. Will continue to be checked at intervals. Treatment plan being formulated to include decision about initiating medications.

## 2017-03-11 NOTE — Progress Notes (Signed)
[facility] Opinion Progress Note for Medication Administration to Non-consenting Patients (For Involuntarily Committed Patients)  Patient: Rebecca Lozano Date of Birth: [birth year] 17, 1982 MRN: [id number]  Reason for the Medication: The patient, without the benefit of the specific treatment measure, is incapable of participating in any available treatment plan that will give the patient a realistic opportunity of improving the patient's condition. There is, without the benefit of the specific treatment measure, a significant possibility that the patient will harm self or others before improvement of the patient's condition is realized.  Consideration of Side Effects: Consideration of the side effects related to the medication plan has been given.  Rationale for Medication Administration:  Asked by Dr. Altamese Carolinaain[place] to assess patient for medication over objection. Patient is a 36 year old female, admitted under IVC , reported suicidal ideations and exhibited dangerous and disorganized behaviors ( walking into traffic, locking herself in bathroom in ED, yelling and screaming at people/staff. She is diagnosed with Bipolar Disorder , Manic. On unit has been pacing , internally preoccupied , yelling at times, angry. At this time patient presents very irritable and angry, approaching Clinical research associatewriter with threatening demeanor,  refusing to talk with Clinical research associatewriter, demanding that I leave her room immediately .      Craige CottaFernando A Kalianna Verbeke, MD 03/11/17  4:58 PM   This documentation is good for (7) seven days from the date of the MD signature. New documentation must be completed every seven (7) days with detailed justification in the medical record if the patient requires continued non-emergent administration of psychotropic medications.

## 2017-03-11 NOTE — Tx Team (Signed)
Interdisciplinary Treatment and Diagnostic Plan Update  03/11/2017 Time of Session: 9:00am  Rebecca Lozano MRN: [id number]  Principal Diagnosis: Bipolar affective disorder, current episode manic with psychotic symptoms (HCC)  Secondary Diagnoses: Principal Problem:   Bipolar affective disorder, current episode manic with psychotic symptoms (HCC) Active Problems:   Psychosis (HCC)   Current Medications:  Current Facility-Administered Medications  Medication Dose Route Frequency Provider Last Rate Last Dose  . acetaminophen (TYLENOL) tablet 650 mg  650 mg Oral Q6H PRN Micheal Likensainville, Christopher T, MD   650 mg at 03/10/17 0926  . calcium carbonate (TUMS - dosed in mg elemental calcium) chewable tablet 200 mg of elemental calcium  1 tablet Oral BID PRN Micheal Likensainville, Christopher T, MD      . diphenhydrAMINE (BENADRYL) capsule 50 mg  50 mg Oral Once [name] ConnBerry, Jason A, NP      . diphenhydrAMINE (BENADRYL) capsule 50 mg  50 mg Oral Q8H PRN Micheal Likensainville, Christopher T, MD      . guaiFENesin (MUCINEX) 12 hr tablet 600 mg  600 mg Oral BID Jolyne Loaainville, Christopher T, MD      . hydrOXYzine (ATARAX/VISTARIL) tablet 25 mg  25 mg Oral Q6H PRN Kerry HoughSimon, Spencer E, PA-C      . LORazepam (ATIVAN) injection 2 mg  2 mg Intramuscular Q6H PRN Micheal Likensainville, Christopher T, MD      . OLANZapine zydis (ZYPREXA) disintegrating tablet 5 mg  5 mg Oral Q8H PRN Kerry HoughSimon, Spencer E, PA-C       And  . LORazepam (ATIVAN) tablet 1 mg  1 mg Oral PRN Donell SievertSimon, Spencer E, PA-C       And  . ziprasidone (GEODON) injection 20 mg  20 mg Intramuscular PRN Kerry HoughSimon, Spencer E, PA-C      . multivitamin with minerals tablet 1 tablet  1 tablet Oral Daily [place], Burlene Arnthristopher T, MD      . traZODone (DESYREL) tablet 50 mg  50 mg Oral QHS,MR X 1 Kerry HoughSimon, Spencer E, PA-C   Stopped at 03/10/17 0151   PTA Medications: Medications Prior to Admission  Medication Sig Dispense Refill Last Dose  . b complex vitamins capsule Take 1 capsule by mouth daily.     . calcium  carbonate (TUMS - DOSED IN MG ELEMENTAL CALCIUM) 500 MG chewable tablet Chew 1 tablet by mouth as needed for indigestion or heartburn.     . Melatonin 3 MG CAPS Take 3 mg by mouth at bedtime as needed (For sleep.).      Marland Kitchen. acetaminophen (TYLENOL) 500 MG tablet Take 1,000 mg by mouth 2 (two) times daily as needed (pain).   03/04/2017 at Unknown time    Patient Stressors:    Patient Strengths:    Treatment Modalities: Medication Management, Group therapy, Case management,  1 to 1 session with clinician, Psychoeducation, Recreational therapy.   Physician Treatment Plan for Primary Diagnosis: Bipolar affective disorder, current episode manic with psychotic symptoms (HCC) Long Term Goal(s): Improvement in symptoms so as ready for discharge Improvement in symptoms so as ready for discharge   Short Term Goals: Ability to maintain clinical measurements within normal limits will improve Compliance with prescribed medications will improve  Medication Management: Evaluate patient's response, side effects, and tolerance of medication regimen.  Therapeutic Interventions: 1 to 1 sessions, Unit Group sessions and Medication administration.  Evaluation of Outcomes: Progressing  Physician Treatment Plan for Secondary Diagnosis: Principal Problem:   Bipolar affective disorder, current episode manic with psychotic symptoms (HCC) Active Problems:   Psychosis (HCC)  Long Term Goal(s): Improvement in symptoms so as ready for discharge Improvement in symptoms so as ready for discharge   Short Term Goals: Ability to maintain clinical measurements within normal limits will improve Compliance with prescribed medications will improve     Medication Management: Evaluate patient's response, side effects, and tolerance of medication regimen.  Therapeutic Interventions: 1 to 1 sessions, Unit Group sessions and Medication administration.  Evaluation of Outcomes: Progressing   RN Treatment Plan for Primary  Diagnosis: Bipolar affective disorder, current episode manic with psychotic symptoms (HCC) Long Term Goal(s): Knowledge of disease and therapeutic regimen to maintain health will improve  Short Term Goals: Ability to remain free from injury will improve, Ability to verbalize frustration and anger appropriately will improve, Ability to demonstrate self-control and Ability to participate in decision making will improve  Medication Management: RN will administer medications as ordered by provider, will assess and evaluate patient's response and provide education to patient for prescribed medication. RN will report any adverse and/or side effects to prescribing provider.  Therapeutic Interventions: 1 on 1 counseling sessions, Psychoeducation, Medication administration, Evaluate responses to treatment, Monitor vital signs and CBGs as ordered, Perform/monitor CIWA, COWS, AIMS and Fall Risk screenings as ordered, Perform wound care treatments as ordered.  Evaluation of Outcomes: Progressing   LCSW Treatment Plan for Primary Diagnosis: Bipolar affective disorder, current episode manic with psychotic symptoms (HCC) Long Term Goal(s): Safe transition to appropriate next level of care at discharge, Engage patient in therapeutic group addressing interpersonal concerns.  Short Term Goals: Engage patient in aftercare planning with referrals and resources, Increase social support, Increase ability to appropriately verbalize feelings, Increase emotional regulation and Increase skills for wellness and recovery  Therapeutic Interventions: Assess for all discharge needs, 1 to 1 time with Social worker, Explore available resources and support systems, Assess for adequacy in community support network, Educate family and significant other(s) on suicide prevention, Complete Psychosocial Assessment, Interpersonal group therapy.  Evaluation of Outcomes: Progressing   Progress in Treatment: Attending groups:  No Participating in groups: No. Taking medication as prescribed: No. Toleration medication: No. Family/Significant other contact made: No, will contact:  CSW assessing  Patient understands diagnosis: No. and As evidenced by:  Limited insight  Discussing patient identified problems/goals with staff: No. Medical problems stabilized or resolved: Yes. Denies suicidal/homicidal ideation: Yes. Issues/concerns per patient self-inventory: No. Other: NA  New problem(s) identified: No, Describe:  NA  New Short Term/Long Term Goal(s):  Discharge Plan or Barriers: Pt plans to return home and follow up with outpatient.    Reason for Continuation of Hospitalization: Hallucinations Medication stabilization  Estimated Length of Stay: 3-5 days   Attendees: Patient: 03/11/2017 3:47 PM  Physician: Dr. Altamese Carolinaainville  03/11/2017 3:47 PM  Nursing: Clydie BraunKaren, RN  03/11/2017 3:47 PM  RN Care Manager: Victorino DikeJennifer, RN  03/11/2017 3:47 PM  Social Worker: Rondall Allegraandace L Nasra Counce, LCSW  03/11/2017 3:47 PM  Recreational Therapist:  03/11/2017 3:47 PM  Other:  03/11/2017 3:47 PM  Other:  03/11/2017 3:47 PM  Other: 03/11/2017 3:47 PM    Scribe for Treatment Team: Rondall Allegraandace L Bryden Darden, LCSW 03/11/2017 3:47 PM

## 2017-03-11 NOTE — Progress Notes (Signed)
Recreation Therapy Notes  Date: 03/11/17 Time: 1000 Location: 500 Hall Dayroom  Group Topic: Communication, Team Building, Problem Solving  Goal Area(s) Addresses:  Patient will effectively work with peers as a team  Patient will identify skill used to make activity successful.  Patient will identify how skills used during activity can be used to reach post d/c goals.   Intervention: Beach ball  Activity:  Patients were placed in a small circle and tossed the ball back and forth to each other.   Education: Pharmacist, communityocial Skills, Building control surveyorDischarge Planning.   Education Outcome: Acknowledges education/In group clarification offered/Needs additional education.   Clinical Observations/Feedback: Pt did not attend group.    Caroll RancherMarjette Marcelle Bebout, LRT/CTRS      Caroll RancherLindsay, Kennisha Qin A 03/11/2017 11:42 AM

## 2017-03-11 NOTE — Progress Notes (Addendum)
[facility]  03/11/2017 11:24 AM Rebecca Lozano  MRN:  [id number]   Subjective:   Rebecca Lozano is a 36 y/o F with history of bipolar disorder who was admitted on IVC from [facility] where she was brought in by police after observed walking in street with disorganized behaviors, irritability, agitation, and SI. In ED pt was labile, irritable, disorganized, and refusing medications. She was be placed on PRN protocol for agitation, and she has been refusing all medications aside from acetaminophen [facility]  Today prior to evaluation, pt was observed and overheard pacing, yelling, and responding to internal stimuli. She appears to have conversations with unseen others. At time of interview, pt was approached in her room and asked about her verbalizations. She replied, "I'm just giving praised to God." Pt remains guarded, irritable, and minimally cooperative with the interview. She is pressured and interrupts this provider repeatedly during the interview, despite multiple redirections. Pt denies SI/HI/AH/VH when asked. When attempting to discuss how best to help her, pt grew increasingly loud, demanding that this provider review her records from her previous providers and stating, "You can't know me - you've only known me for 2 days, so how can you say that you know me?" Pt was reassured that medical care could safely be provided for her even after knowing her for a brief time. As pt continued to escalate, interview was concluded. Pt was informed that she would not be started on scheduled medications at this time as per request, but there are PRN orders in place for RN staff to utilize if pt is unable to manage her agitation/intrusiveness and is causing disruption for other patients. Pt verbalized good understanding, and interview was concluded.   After conclusion of interview, pt continued to have ongoing yelling and responding to internal stimuli. We will request for second opinion for forced  medications.   Principal Problem: Bipolar affective disorder, current episode manic with psychotic symptoms (HCC) Diagnosis:   Patient Active Problem List   Diagnosis Date Noted  . Bipolar affective disorder, current episode manic with psychotic symptoms (HCC) [F31.2] 03/10/2017  . Psychosis (HCC) [F29] 03/09/2017   Total Time spent with patient: 30 minutes  Past Psychiatric History: see H&P  Past Medical History:  Past Medical History:  Diagnosis Date  . Bipolar 1 disorder (HCC)   . CHF (congestive heart failure) (HCC)   . GERD (gastroesophageal reflux disease)   . Hypertension   . Seizures (HCC)     Past Surgical History:  Procedure Laterality Date  . NO PAST SURGERIES     Family History: History reviewed. No pertinent family history. Family Psychiatric  History: see H&P Social History:  Social History   Substance and Sexual Activity  Alcohol Use No     Social History   Substance and Sexual Activity  Drug Use No    Social History   Socioeconomic History  . Marital status: Single    Spouse name: None  . Number of children: None  . Years of education: None  . Highest education level: None  Social Needs  . Financial resource strain: None  . Food insecurity - worry: None  . Food insecurity - inability: None  . Transportation needs - medical: None  . Transportation needs - non-medical: None  Occupational History  . None  Tobacco Use  . Smoking status: Current Every Day Smoker    Packs/day: 1.00    Types: Cigarettes, Cigars  . Smokeless tobacco: Never Used  Substance and Sexual Activity  . Alcohol use: No  . Drug use: No  . Sexual activity: Not Currently  Other Topics Concern  . None  Social History Narrative  . None   Additional Social History:                         Sleep: Fair  Appetite:  Fair  Current Medications: Current Facility-Administered Medications  Medication Dose Route Frequency Provider Last Rate Last Dose  .  acetaminophen (TYLENOL) tablet 650 mg  650 mg Oral Q6H PRN Micheal Likens, MD   650 mg at 03/10/17 0926  . calcium carbonate (TUMS - dosed in mg elemental calcium) chewable tablet 200 mg of elemental calcium  1 tablet Oral BID PRN Micheal Likens, MD      . diphenhydrAMINE (BENADRYL) capsule 50 mg  50 mg Oral Once [name] Conn A, NP      . diphenhydrAMINE (BENADRYL) capsule 50 mg  50 mg Oral Q8H PRN Micheal Likens, MD      . guaiFENesin (MUCINEX) 12 hr tablet 600 mg  600 mg Oral BID Jolyne Loa T, MD      . hydrOXYzine (ATARAX/VISTARIL) tablet 25 mg  25 mg Oral Q6H PRN Kerry Hough, PA-C      . LORazepam (ATIVAN) injection 2 mg  2 mg Intramuscular Q6H PRN Micheal Likens, MD      . OLANZapine zydis (ZYPREXA) disintegrating tablet 5 mg  5 mg Oral Q8H PRN Kerry Hough, PA-C       And  . LORazepam (ATIVAN) tablet 1 mg  1 mg Oral PRN Donell Sievert E, PA-C       And  . ziprasidone (GEODON) injection 20 mg  20 mg Intramuscular PRN Kerry Hough, PA-C      . multivitamin with minerals tablet 1 tablet  1 tablet Oral Daily Cherice Glennie, Burlene Arnt, MD      . traZODone (DESYREL) tablet 50 mg  50 mg Oral QHS,MR X 1 Kerry Hough, PA-C   Stopped at 03/10/17 0151    Lab Results: No results found for this or any previous visit (from the past 48 hour(s)).  Blood Alcohol level:  Lab Results  Component Value Date   ETH <10 03/05/2017   ETH (H) 08/19/2010    <11        LOWEST DETECTABLE LIMIT FOR SERUM ALCOHOL IS 5 mg/dL FOR MEDICAL PURPOSES ONLY    Metabolic Disorder Labs: No results found for: HGBA1C, MPG No results found for: PROLACTIN No results found for: CHOL, TRIG, HDL, CHOLHDL, VLDL, LDLCALC  Physical Findings: AIMS: Facial and Oral Movements Muscles of Facial Expression: None, normal Lips and Perioral Area: None, normal Jaw: None, normal Tongue: None, normal,Extremity Movements Upper (arms, wrists, hands, fingers): None,  normal Lower (legs, knees, ankles, toes): None, normal, Trunk Movements Neck, shoulders, hips: None, normal, Overall Severity Severity of abnormal movements (highest score from questions above): None, normal Incapacitation due to abnormal movements: None, normal Patient's awareness of abnormal movements (rate only patient's report): No Awareness, Dental Status Current problems with teeth and/or dentures?: No Does patient usually wear dentures?: No  CIWA:  CIWA-Ar Total: 4 COWS:     Musculoskeletal: Strength & Muscle Tone: within normal limits Gait & Station: normal Patient leans: N/A  Psychiatric Specialty Exam: Physical Exam  Nursing note and vitals reviewed.   Review of Systems  Constitutional: Negative for chills and fever.  Respiratory: Negative  for cough and shortness of breath.   Cardiovascular: Negative for chest pain.  Gastrointestinal: Negative for abdominal pain, heartburn, nausea and vomiting.  Psychiatric/Behavioral: Positive for depression and hallucinations. Negative for suicidal ideas.    Blood pressure 125/83, pulse 99, temperature 98.6 F (37 C), temperature source Oral, resp. rate 16, height 5\' 8"  (1.727 m), weight 108.9 kg (240 lb).Body mass index is 36.49 kg/m.  General Appearance: Casual  Eye Contact:  Good  Speech:  Pressured  Volume:  Increased  Mood:  Angry, Anxious, Dysphoric and Irritable  Affect:  Blunt, Inappropriate, Labile and Tearful  Thought Process:  Disorganized and Descriptions of Associations: Loose  Orientation:  Full (Time, Place, and Person)  Thought Content:  Illogical and Paranoid Ideation  Suicidal Thoughts:  No  Homicidal Thoughts:  No  Memory:  Immediate;   Fair Recent;   Fair Remote;   Fair  Judgement:  Poor  Insight:  Lacking  Psychomotor Activity:  Increased  Concentration:  Concentration: Poor  Recall:  FiservFair  Fund of Knowledge:  Fair  Language:  Fair  Akathisia:  No  Handed:    AIMS (if indicated):     Assets:   Manufacturing systems engineerCommunication Skills Physical Health Resilience Social Support  ADL's:  Intact  Cognition:  WNL  Sleep:  Number of Hours: 1.5     Treatment Plan Summary: Daily contact with patient to assess and evaluate symptoms and progress in treatment and Medication management. Pt continues to have irritability, pressured speech, mood lability, poor sleep, and responding to internal stimuli; however, she refuses all medications at this time, and when she is approaching need for forced PRN medications to manage behaviors which are nearing agitation, pt is able to de-escalate herself. Discussed with patient that scheduled medications will not be started at this time, but without significant improvement of her current symptoms, we will proceed with attempting to obtain authorization for forced medications.   - Continue inpatient hospitalization (on IVC)  - Bipolar I current episode manic with psychotic features             - Pt current refusing all scheduled psychotropic medications (we will attempt to seek for authorization for forced medications)             - Continue agitation protocol with geodon/zydis/ativan to manage agitation   - request for consult for second opinion for forced medications     - If second opinion recommends forced medications, we will plan to start zydis 10mg  po qhs (or zyprexa 10mg  IM qhs if pt refuses oral form)  - Encourage participation in groups and therapeutic milieu  - Increase collateral information  - Discharge planning will be ongoing   Micheal Likenshristopher T Baran Kuhrt, MD 03/11/2017, 11:24 AM

## 2017-03-11 NOTE — Progress Notes (Addendum)
Patient ID: Rebecca Lozano, female   DOB: 1981/01/07, 36 y.o.   MRN: [id number]  DAR NOTE  Pt continues to be very confused and disorganized; needs constant redirecting. Pt observed pacing the hall and talking loudly with self. Pt complained of restlessness and agitation, "I can't just have whatever I want without somebody saying something."  Pt remained in room all evening. Pt is very paranoid, loud, confused and disorganized. Pt continually addresses staffs using curse words; will not listen to anyone. Pt is actively hallucinating-having loud and intense conversation with self. Pt refused to participate, refused all medications. Pt does not look to be in any distress at this time. Support, encouragement, and safe environment provided. 15-minute safety checks continue. Safety checks continue.

## 2017-03-12 DIAGNOSIS — G4700 Insomnia, unspecified: Secondary | ICD-10-CM

## 2017-03-12 DIAGNOSIS — F419 Anxiety disorder, unspecified: Secondary | ICD-10-CM

## 2017-03-12 DIAGNOSIS — R450 Nervousness: Secondary | ICD-10-CM

## 2017-03-12 MED ORDER — HYDROXYZINE HCL 50 MG PO TABS: 50.0000 mg | ORAL_TABLET | Freq: Four times a day (QID) | ORAL | Status: DC | PRN

## 2017-03-12 MED ORDER — LORAZEPAM 2 MG/ML IJ SOLN
1.0000 mg | Freq: Two times a day (BID) | INTRAMUSCULAR | Status: DC
  Filled 2017-03-12: qty 1

## 2017-03-12 MED ORDER — LORAZEPAM 2 MG/ML IJ SOLN: 1.0000 mg | Freq: Three times a day (TID) | INTRAMUSCULAR | Status: DC | PRN

## 2017-03-12 MED ORDER — HALOPERIDOL LACTATE 5 MG/ML IJ SOLN: 5.0000 mg | Freq: Three times a day (TID) | INTRAMUSCULAR | Status: DC | PRN

## 2017-03-12 MED ORDER — HALOPERIDOL LACTATE 5 MG/ML IJ SOLN
5.0000 mg | Freq: Two times a day (BID) | INTRAMUSCULAR | Status: DC
  Filled 2017-03-12 (×6): qty 1

## 2017-03-12 MED ORDER — HALOPERIDOL LACTATE 5 MG/ML IJ SOLN
5.0000 mg | Freq: Four times a day (QID) | INTRAMUSCULAR | Status: DC | PRN
  Administered 2017-03-12: 5 mg via INTRAMUSCULAR
  Filled 2017-03-12: qty 1

## 2017-03-12 MED ORDER — HALOPERIDOL 5 MG PO TABS
5.0000 mg | ORAL_TABLET | Freq: Two times a day (BID) | ORAL | Status: DC
  Administered 2017-03-12 – 2017-03-14 (×4): 5 mg via ORAL
  Filled 2017-03-12 (×8): qty 1

## 2017-03-12 MED ORDER — DIPHENHYDRAMINE HCL 50 MG/ML IJ SOLN
50.0000 mg | Freq: Four times a day (QID) | INTRAMUSCULAR | Status: DC | PRN
  Administered 2017-03-12: 50 mg via INTRAMUSCULAR
  Filled 2017-03-12: qty 1

## 2017-03-12 MED ORDER — LORAZEPAM 1 MG PO TABS
1.0000 mg | ORAL_TABLET | Freq: Two times a day (BID) | ORAL | Status: DC
  Administered 2017-03-12 – 2017-03-14 (×4): 1 mg via ORAL
  Filled 2017-03-12 (×6): qty 1

## 2017-03-12 MED ORDER — RISPERIDONE 1 MG PO TBDP
1.0000 mg | ORAL_TABLET | Freq: Every day | ORAL | Status: DC
  Filled 2017-03-12 (×2): qty 1

## 2017-03-12 MED ORDER — LORAZEPAM 2 MG/ML IJ SOLN
2.0000 mg | Freq: Four times a day (QID) | INTRAMUSCULAR | Status: DC | PRN
  Administered 2017-03-12: 2 mg via INTRAMUSCULAR
  Filled 2017-03-12: qty 1

## 2017-03-12 MED ORDER — DIPHENHYDRAMINE HCL 25 MG PO CAPS: 50.0000 mg | ORAL_CAPSULE | Freq: Four times a day (QID) | ORAL | Status: DC | PRN

## 2017-03-12 MED ORDER — RISPERIDONE 1 MG PO TBDP
1.0000 mg | ORAL_TABLET | Freq: Once | ORAL | Status: AC
  Administered 2017-03-12: 1 mg via ORAL
  Filled 2017-03-12 (×2): qty 1

## 2017-03-12 NOTE — [facility] Group Notes (Signed)
[facility] Group Notes: (Clinical Social Work)   03/12/2017      Type of Therapy:  Group Therapy   Participation Level:  Did Not Attend due to psychosis - was not asked by techs   Ambrose MantleMareida Grossman-Orr, LCSW 03/12/2017, 12:16 PM

## 2017-03-12 NOTE — Progress Notes (Signed)
[facility]  03/12/2017 10:49 AM Rebecca Lozano  MRN:  [id number]   Subjective:  "I don't want to talk to anyone. I don't want to get dressed! I'm naked! Don't come in here!"  Objective: Pt seen and chart reviewed. Pt is alert/oriented x4, agitated, uncoperative, and inappropriate to situation. Pt denies suicidal/homicidal ideation and psychosis. However, pt present as paranoid, fearful, and not willing to participate in the treatment plan. Pt is ruminative about her "husband" who she states she does not talk to, and is ruminative about staff members' name tags telling them they didn't go to school very long or that they are not qualified to take care of her or anyone.    Pt refused to come out of her room several times yet eventually stepped in the hallway to have a conversation with me, which was very abrasive and hostile. Pt stood in the hall with me and several staff members and we had a discussion in which she was severely agitated. Dr. Jama Flavorsobos later attempted to speak with her at which time she was also very agitated.    Principal Problem: Bipolar affective disorder, current episode manic with psychotic symptoms (HCC) Diagnosis:   Patient Active Problem List   Diagnosis Date Noted  . Bipolar affective disorder, current episode manic with psychotic symptoms (HCC) [F31.2] 03/10/2017  . Psychosis (HCC) [F29] 03/09/2017   Total Time spent with patient: 30 minutes  Past Psychiatric History: see H&P  Past Medical History:  Past Medical History:  Diagnosis Date  . Bipolar 1 disorder (HCC)   . CHF (congestive heart failure) (HCC)   . GERD (gastroesophageal reflux disease)   . Hypertension   . Seizures (HCC)     Past Surgical History:  Procedure Laterality Date  . NO PAST SURGERIES     Family History: History reviewed. No pertinent family history. Family Psychiatric  History: see H&P Social History:  Social History   Substance and Sexual Activity  Alcohol Use No      Social History   Substance and Sexual Activity  Drug Use No    Social History   Socioeconomic History  . Marital status: Single    Spouse name: None  . Number of children: None  . Years of education: None  . Highest education level: None  Social Needs  . Financial resource strain: None  . Food insecurity - worry: None  . Food insecurity - inability: None  . Transportation needs - medical: None  . Transportation needs - non-medical: None  Occupational History  . None  Tobacco Use  . Smoking status: Current Every Day Smoker    Packs/day: 1.00    Types: Cigarettes, Cigars  . Smokeless tobacco: Never Used  Substance and Sexual Activity  . Alcohol use: No  . Drug use: No  . Sexual activity: Not Currently  Other Topics Concern  . None  Social History Narrative  . None   Additional Social History:                         Sleep: Fair  Appetite:  Fair  Current Medications: Current Facility-Administered Medications  Medication Dose Route Frequency Provider Last Rate Last Dose  . acetaminophen (TYLENOL) tablet 650 mg  650 mg Oral Q6H PRN Micheal Likensainville, Christopher T, MD   650 mg at 03/10/17 0926  . calcium carbonate (TUMS - dosed in mg elemental calcium) chewable tablet 200 mg of elemental calcium  1 tablet Oral BID  PRN Micheal Likensainville, Christopher T, MD      . guaiFENesin (MUCINEX) 12 hr tablet 600 mg  600 mg Oral BID Micheal Likensainville, Christopher T, MD      . haloperidol (HALDOL) tablet 5 mg  5 mg Oral BID Withrow, John C, FNP       Or  . haloperidol lactate (HALDOL) injection 5 mg  5 mg Intramuscular BID Withrow, John C, FNP      . haloperidol lactate (HALDOL) injection 5 mg  5 mg Intramuscular Q8H PRN Withrow, Everardo AllJohn C, FNP       And  . LORazepam (ATIVAN) injection 1 mg  1 mg Intramuscular Q8H PRN Withrow, Everardo AllJohn C, FNP      . LORazepam (ATIVAN) tablet 1 mg  1 mg Oral BID Withrow, Everardo AllJohn C, FNP       Or  . LORazepam (ATIVAN) injection 1 mg  1 mg Intramuscular BID Withrow, John  C, FNP      . multivitamin with minerals tablet 1 tablet  1 tablet Oral Daily [place], Burlene Arnthristopher T, MD      . traZODone (DESYREL) tablet 50 mg  50 mg Oral QHS,MR X 1 Kerry HoughSimon, Spencer E, PA-C   Stopped at 03/10/17 0151    Lab Results: No results found for this or any previous visit (from the past 48 hour(s)).  Blood Alcohol level:  Lab Results  Component Value Date   ETH <10 03/05/2017   ETH (H) 08/19/2010    <11        LOWEST DETECTABLE LIMIT FOR SERUM ALCOHOL IS 5 mg/dL FOR MEDICAL PURPOSES ONLY    Metabolic Disorder Labs: No results found for: HGBA1C, MPG No results found for: PROLACTIN No results found for: CHOL, TRIG, HDL, CHOLHDL, VLDL, LDLCALC  Physical Findings: AIMS: Facial and Oral Movements Muscles of Facial Expression: None, normal Lips and Perioral Area: None, normal Jaw: None, normal Tongue: None, normal,Extremity Movements Upper (arms, wrists, hands, fingers): None, normal Lower (legs, knees, ankles, toes): None, normal, Trunk Movements Neck, shoulders, hips: None, normal, Overall Severity Severity of abnormal movements (highest score from questions above): None, normal Incapacitation due to abnormal movements: None, normal Patient's awareness of abnormal movements (rate only patient's report): No Awareness, Dental Status Current problems with teeth and/or dentures?: No Does patient usually wear dentures?: No  CIWA:  CIWA-Ar Total: 4 COWS:     Musculoskeletal: Strength & Muscle Tone: within normal limits Gait & Station: normal Patient leans: N/A  Psychiatric Specialty Exam: Physical Exam  Nursing note and vitals reviewed.   Review of Systems  Constitutional: Negative for chills and fever.  Respiratory: Negative for cough and shortness of breath.   Cardiovascular: Negative for chest pain.  Gastrointestinal: Negative for abdominal pain, heartburn, nausea and vomiting.  Psychiatric/Behavioral: Positive for depression and hallucinations. Negative for  suicidal ideas. The patient is nervous/anxious and has insomnia.     Blood pressure 125/83, pulse 99, temperature 98.6 F (37 C), temperature source Oral, resp. rate 16, height 5\' 8"  (1.727 m), weight 108.9 kg (240 lb).Body mass index is 36.49 kg/m.  General Appearance: Casual and well-groomed  Eye Contact:  Good, intense  Speech:  Pressured and ruminative, aggressive  Volume:  Increased  Mood:  Angry, Anxious, Dysphoric and Irritable  Affect:  Blunt, Inappropriate, Labile and Tearful  Thought Process:  Disorganized and Descriptions of Associations: Loose  Orientation:  Full (Time, Place, and Person)  Thought Content:  Focused on wanting to discharge home  Suicidal Thoughts:  No  Homicidal Thoughts:  No  Memory:  Immediate;   Fair Recent;   Fair Remote;   Fair  Judgement:  Poor  Insight:  Lacking  Psychomotor Activity:  Increased  Concentration:  Concentration: Poor  Recall:  [employer] of Knowledge:  Fair  Language:  Fair  Akathisia:  No  Handed:    AIMS (if indicated):     Assets:  Manufacturing systems engineer Physical Health Resilience Social Support  ADL's:  Intact  Cognition:  WNL  Sleep:  Number of Hours: 4.25   Reviewed treatment plan today on 03/12/2017 and modified with changes in bold; all other areas are working well and will allow levels to reach serum therapeutic as pt begins to improve.   Treatment Plan Summary: Bipolar affective disorder, current episode manic with psychotic symptoms (HCC) unstable, managed as below  Daily contact with patient to assess and evaluate symptoms and progress in treatment and Medication management.  - Continue inpatient hospitalization (on IVC)  - Bipolar I current episode manic with psychotic features             - Pt current refusing all scheduled psychotropic medications (we will attempt to seek for authorization for forced medications)             -Stat EKG 12-lead for QTC   - Dr. Jama Flavors did consult for second opinion for forced  medications    -Give Ativan 2mg , Haldol 5mg , Benadryl 50mg  all IM x 1 dose (severe agitation, security is present)                 -Discontinue Zyprexa               -Discontinue Risperidone                -Start Haldol 5mg  + Ativan 1mg  PO bid (may give IM if won't take PO), scheduled               -Eliminate other antipsychotics and make one PRN option of Haldol 5mg , Ativan 1mg  (IM/PO) Q8H PRN severe agitation/aggression  - Encourage participation in groups and therapeutic milieu  - Increase collateral information  - Discharge planning will be ongoing   Beau Fanny, FNP 03/12/2017, 10:49 AM   Agree with NP Progress Note

## 2017-03-12 NOTE — Progress Notes (Signed)
Patient up in milieu and was offered scheduled Ativan and Haldol. Stated that she would take the ativan. When Ativan was given in cup, patient refused stating that she is pregnant and she cannot take these drugs. Speech calmer; requested drug information printouts which were provided. Patient took TUMS chewable and Tylenol for back pain. Patient would like it noted that she does well on Vistaril 50mg  and would like that ordered, and that she has also done well on Topamax for mood stabilization.

## 2017-03-12 NOTE — [facility] Counselor (Signed)
PSA attempted, but patient refused, stating she does not want to talk to anyone until Monday.  Was aggressive in her speech and talked about calling her Medicaid representative, giving that person CSW's name.  Will be attempted again tomorrow.  Ambrose MantleMareida Grossman-Orr, LCSW 03/12/2017, 12:18 PM

## 2017-03-12 NOTE — Progress Notes (Signed)
Patient has been verbally aggressive and abusive with staff and peers, yelling and threatening. Refused all medications. Argumentative and belligerent interactions. After threatening another patient, this patient was given IM haldol, ativan and benedryl without physical restraint. Currently in bed, appears asleep.

## 2017-03-12 NOTE — Progress Notes (Signed)
Patient awake and came to dayroom for meal. Hostile interaction with staff however she is not loud and aggressive at this time.

## 2017-03-12 NOTE — Progress Notes (Signed)
Patient has been verbally aggressive with Clinical research associatewriter and staff. Writer informed her of medications scheduled which seemed to escalate her agressiveness verbally. She was informed early on about her medications so she could decide which she wanted, pill or injection. She chose the pills but was very agitated. She took snack to her room and eventually calmed down and went to sleep. Safety maintained on unit with 15 min checks.

## 2017-03-12 NOTE — Progress Notes (Signed)
Patient has been isolative to her room and when doing room checks she becomes agitated and will yell and scream when opening her door. Writer in formed her of her medication order and how she had a choice to take pill or injection. She chose to take th pill after a lot of yelling at staff, waking up other patients.

## 2017-03-13 MED ORDER — HYDROXYZINE HCL 25 MG PO TABS
25.0000 mg | ORAL_TABLET | Freq: Four times a day (QID) | ORAL | Status: DC | PRN
  Administered 2017-03-14: 25 mg via ORAL
  Filled 2017-03-13: qty 1

## 2017-03-13 NOTE — [facility] Group Notes (Signed)
[facility] LCSW Group Therapy Note  Date/Time:  03/13/2017  11:00AM-12:00PM  Type of Therapy and Topic:  Group Therapy:  Music and Mood  Participation Level:  Did Not Attend   Description of Group: In this process group, members listened to a variety of genres of music and identified that different types of music evoke different responses.  Patients were encouraged to identify music that was soothing for them and music that was energizing for them.  Patients discussed how this knowledge can help with wellness and recovery in various ways including managing depression and anxiety as well as encouraging healthy sleep habits.    Therapeutic Goals: 1. Patients will explore the impact of different varieties of music on mood 2. Patients will verbalize the thoughts they have when listening to different types of music 3. Patients will identify music that is soothing to them as well as music that is energizing to them 4. Patients will discuss how to use this knowledge to assist in maintaining wellness and recovery 5. Patients will explore the use of music as a coping skill  Summary of Patient Progress:  N/A  Therapeutic Modalities: Solution Focused Brief Therapy Activity   Maudie Shingledecker Grossman-Orr, LCSW    

## 2017-03-13 NOTE — Progress Notes (Signed)
Pt refused pregnancy test. Pt stated "I'm already pregnant. I had blood work done so I don't need a urine pregnancy test".

## 2017-03-13 NOTE — Progress Notes (Signed)
[facility]  03/13/2017 3:43 PM Rebecca Lozano  MRN:  [id number]   Subjective:  "I'll talk to you today. We can talk. But I want all these meds ordered and more pregnancy tests."  Objective: Pt seen and chart reviewed. Pt is alert/oriented x4, agitated, uncoperative, and inappropriate to situation. Pt denies suicidal/homicidal ideation and psychosis. Pt continues to ruminate about her "husband" which we are not sure is real or not. She was calmer today and more reasonable until we began discussing medications. She reports that she wants numerous medications (vitamins, vistaril, etc) and became very agitated when I informed her that she did not need all this.    Principal Problem: Bipolar affective disorder, current episode manic with psychotic symptoms (HCC) Diagnosis:   Patient Active Problem List   Diagnosis Date Noted  . Bipolar affective disorder, current episode manic with psychotic symptoms (HCC) [F31.2] 03/10/2017    Priority: High  . Psychosis (HCC) [F29] 03/09/2017   Total Time spent with patient: 30 minutes  Past Psychiatric History: see H&P  Past Medical History:  Past Medical History:  Diagnosis Date  . Bipolar 1 disorder (HCC)   . CHF (congestive heart failure) (HCC)   . GERD (gastroesophageal reflux disease)   . Hypertension   . Seizures (HCC)     Past Surgical History:  Procedure Laterality Date  . NO PAST SURGERIES     Family History: History reviewed. No pertinent family history. Family Psychiatric  History: see H&P Social History:  Social History   Substance and Sexual Activity  Alcohol Use No     Social History   Substance and Sexual Activity  Drug Use No    Social History   Socioeconomic History  . Marital status: Single    Spouse name: None  . Number of children: None  . Years of education: None  . Highest education level: None  Social Needs  . Financial resource strain: None  . Food insecurity - worry: None  . Food insecurity -  inability: None  . Transportation needs - medical: None  . Transportation needs - non-medical: None  Occupational History  . None  Tobacco Use  . Smoking status: Current Every Day Smoker    Packs/day: 1.00    Types: Cigarettes, Cigars  . Smokeless tobacco: Never Used  Substance and Sexual Activity  . Alcohol use: No  . Drug use: No  . Sexual activity: Not Currently  Other Topics Concern  . None  Social History Narrative  . None   Additional Social History:                         Sleep: Fair  Appetite:  Fair  Current Medications: Current Facility-Administered Medications  Medication Dose Route Frequency Provider Last Rate Last Dose  . acetaminophen (TYLENOL) tablet 650 mg  650 mg Oral Q6H PRN Micheal [place], Christopher T, MD   650 mg at 03/13/17 0749  . calcium carbonate (TUMS - dosed in mg elemental calcium) chewable tablet 200 mg of elemental calcium  1 tablet Oral BID PRN Micheal [place], Christopher T, MD   200 mg of elemental calcium at 03/13/17 1337  . guaiFENesin (MUCINEX) 12 hr tablet 600 mg  600 mg Oral BID Jolyne Loaainville, Christopher T, MD   600 mg at 03/13/17 0748  . haloperidol (HALDOL) tablet 5 mg  5 mg Oral BID Beau FannyWithrow, John C, FNP   5 mg at 03/13/17 0747   Or  . haloperidol lactate (  HALDOL) injection 5 mg  5 mg Intramuscular BID Withrow, John C, FNP      . haloperidol lactate (HALDOL) injection 5 mg  5 mg Intramuscular Q8H PRN Withrow, Everardo AllJohn C, FNP       And  . LORazepam (ATIVAN) injection 1 mg  1 mg Intramuscular Q8H PRN Withrow, Everardo AllJohn C, FNP      . LORazepam (ATIVAN) tablet 1 mg  1 mg Oral BID Beau FannyWithrow, John C, FNP   1 mg at 03/13/17 0747   Or  . LORazepam (ATIVAN) injection 1 mg  1 mg Intramuscular BID Withrow, John C, FNP      . multivitamin with minerals tablet 1 tablet  1 tablet Oral Daily [place], Burlene Arnthristopher T, MD      . traZODone (DESYREL) tablet 50 mg  50 mg Oral QHS,MR X 1 Kerry HoughSimon, Spencer E, PA-C   Stopped at 03/10/17 0151    Lab Results: No results  found for this or any previous visit (from the past 48 hour(s)).  Blood Alcohol level:  Lab Results  Component Value Date   ETH <10 03/05/2017   ETH (H) 08/19/2010    <11        LOWEST DETECTABLE LIMIT FOR SERUM ALCOHOL IS 5 mg/dL FOR MEDICAL PURPOSES ONLY    Metabolic Disorder Labs: No results found for: HGBA1C, MPG No results found for: PROLACTIN No results found for: CHOL, TRIG, HDL, CHOLHDL, VLDL, LDLCALC  Physical Findings: AIMS: Facial and Oral Movements Muscles of Facial Expression: None, normal Lips and Perioral Area: None, normal Jaw: None, normal Tongue: None, normal,Extremity Movements Upper (arms, wrists, hands, fingers): None, normal Lower (legs, knees, ankles, toes): None, normal, Trunk Movements Neck, shoulders, hips: None, normal, Overall Severity Severity of abnormal movements (highest score from questions above): None, normal Incapacitation due to abnormal movements: None, normal Patient's awareness of abnormal movements (rate only patient's report): No Awareness, Dental Status Current problems with teeth and/or dentures?: No Does patient usually wear dentures?: No  CIWA:  CIWA-Ar Total: 4 COWS:     Musculoskeletal: Strength & Muscle Tone: within normal limits Gait & Station: normal Patient leans: N/A  Psychiatric Specialty Exam: Physical Exam  Nursing note and vitals reviewed.   Review of Systems  Constitutional: Negative for chills and fever.  Respiratory: Negative for cough and shortness of breath.   Cardiovascular: Negative for chest pain.  Gastrointestinal: Negative for abdominal pain, heartburn, nausea and vomiting.  Psychiatric/Behavioral: Positive for depression and hallucinations. Negative for suicidal ideas. The patient is nervous/anxious and has insomnia.     Blood pressure 134/76, pulse (!) 115, temperature 98.5 F (36.9 C), temperature source Oral, resp. rate 20, height 5\' 8"  (1.727 m), weight 108.9 kg (240 lb).Body mass index is  36.49 kg/m.  General Appearance: Casual and well-groomed  Eye Contact:  Good, intense  Speech:  Pressured and ruminative, somewhat improving  Volume:  Increased  Mood:  Angry, Anxious, Dysphoric and Irritable  Affect:  Blunt, Inappropriate, Labile and Tearful  Thought Process:  Disorganized and Descriptions of Associations: Loose  Orientation:  Full (Time, Place, and Person)  Thought Content:  Focused on wanting severeal meds  Suicidal Thoughts:  No  Homicidal Thoughts:  No  Memory:  Immediate;   Fair Recent;   Fair Remote;   Fair  Judgement:  Poor  Insight:  Lacking  Psychomotor Activity:  Increased  Concentration:  Concentration: Poor  Recall:  [employer]  Fund of Knowledge:  Fair  Language:  Fair  Akathisia:  No  Handed:    AIMS (if indicated):     Assets:  Communication Skills Physical Health Resilience Social Support  ADL's:  Intact  Cognition:  WNL  Sleep:  Number of Hours: 5   Reviewed treatment plan today on 03/13/2017 and modified with changes in bold; all other areas are working well and will allow levels to reach serum therapeutic as pt begins to improve.   Treatment Plan Summary: Bipolar affective disorder, current episode manic with psychotic symptoms (HCC) unstable, managed as below  Daily contact with patient to assess and evaluate symptoms and progress in treatment and Medication management.  - Continue inpatient hospitalization (on IVC)  - Bipolar I current episode manic with psychotic features             - Pt current refusing all scheduled psychotropic medications (we will attempt to seek for authorization for forced medications)             -Stat EKG 12-lead for QTC (QTC 474, all else WNL with NSR)   - Dr. Jama Flavors did consult for second opinion for forced medications    -Continue Haldol 5mg  + Ativan 1mg  PO bid (may give IM if won't take PO), scheduled               -Eliminate other antipsychotics and make one PRN option of Haldol 5mg , Ativan 1mg  (IM/PO)  Q8H PRN severe agitation/aggression  - Encourage participation in groups and therapeutic milieu  - Increase collateral information  - Discharge planning will be ongoing   Beau Fanny, FNP 03/13/2017, 3:43 PM  Agree with NP Progress Note

## 2017-03-13 NOTE — [facility] Counselor (Signed)
PSA attempted, but patient continues to refuse to talk to social worker until Monday.  Ambrose MantleMareida Grossman-Orr, LCSW 03/13/2017, 1:18 PM

## 2017-03-13 NOTE — Progress Notes (Signed)
Pt presents with an animated affect and a labile mood. Pt verbally aggressive and argumentative towards staff. Pt hesitant to taking meds and requires encouragement. Pt Pt noted to be easily agitated on approach. Pt willingly came to medication window this morning to take meds but was verbally aggressive and demanding to nurse. Pt would only take certain meds when offered. Pt appears to be responding to internal stimuli as she is noted to be walking and talking to herself.  Medications reviewed with pt. Medications administered as ordered per MD. Lorenda PeckVerbals support provided.  15 minute checks performed for safety.

## 2017-03-13 NOTE — Progress Notes (Addendum)
Per Dr. Jama Flavorsobos, obtain an EKG. Pt is refusing EKG at this time.

## 2017-03-13 NOTE — Progress Notes (Signed)
EKG completed. Pt was verbally aggressive and argumentative with Clinical research associatewriter after having EKG performed. Pt insisted on having a copy of the EKG. Writer explained to pt that the copy of the EKG had to be given to MD and due to charges to Biochemist, clinicalepic writer could only print one copy. Pt unable to process and became agitated, loud, demanding and difficult to redirect.

## 2017-03-14 MED ORDER — ZIPRASIDONE HCL 40 MG PO CAPS
40.0000 mg | ORAL_CAPSULE | Freq: Two times a day (BID) | ORAL | Status: DC
  Administered 2017-03-14 – 2017-03-15 (×2): 40 mg via ORAL
  Filled 2017-03-14 (×5): qty 1

## 2017-03-14 MED ORDER — GABAPENTIN 100 MG PO CAPS
100.0000 mg | ORAL_CAPSULE | Freq: Two times a day (BID) | ORAL | Status: DC
  Administered 2017-03-14 – 2017-03-15 (×2): 100 mg via ORAL
  Filled 2017-03-14 (×4): qty 1

## 2017-03-14 MED ORDER — HYDROXYZINE HCL 50 MG PO TABS
50.0000 mg | ORAL_TABLET | Freq: Four times a day (QID) | ORAL | Status: DC | PRN
  Administered 2017-03-14 – 2017-03-20 (×4): 50 mg via ORAL
  Filled 2017-03-14 (×4): qty 1

## 2017-03-14 NOTE — Progress Notes (Signed)
Patient recently came out of her room after being asleep since shift change. She was much calmer and polite. She put her dirty clothes out to be washed and requested tylenol for toe pain which she received. She returned to her room to rest. She has not been as preoccupied as the two previous nights talking to herself in her room. Support given and safety maintained on unit with 15 min checks.

## 2017-03-14 NOTE — Progress Notes (Addendum)
Patient ID: Rebecca Lozano, female   DOB: [birth year], 36 y.o.   MRN: [id number]  Pt has remained in bed since shift change. Pt currently presents with an irritable affect and guarded behavior. Pt is able to remain in her room and quiet when she awakens in the early morning. Pt sleep pattern is cuurently maladaptive.  Pt's labs and vitals were monitored throughout the night. Pt safety ensured during the shift tonight with RN visual assessment and 15 minute checks. Pt supported and encouraged to express concerns and questions.  Pt currently denies SI/HI and A/V hallucinations. Pt verbally agrees to seek staff if SI/HI or A/VH occurs and to consult with staff before acting on any harmful thoughts. Upon awakening patient curses at staff and is found sitting on her bed stark naked. Will continue POC.

## 2017-03-14 NOTE — Progress Notes (Signed)
Recreation Therapy Notes  Date: 03/14/17 Time: 1000 Location: [address] Dayroom  Group Topic:  Goal Setting  Goal Area(s) Addresses:  Patient will be able to identify at least 3 life goals.  Patient will be able to identify benefit of investing in goals.  Patient will be able to identify benefit of setting life goals.   Intervention: Worksheet  Activity: Life Goals.  Patients were given a worksheet that was divided into 6 categories (family, friends, work/school, spirituality, body and mental health).  For each category, patients were to identify what they were doing well, what they needed to improve and set a goal to make the improvement.  Education:  Discharge Planning, Coping Skills, Goal Setting   Education Outcome: Acknowledges Education/In Group Clarification Provided/Needs Additional Education  Clinical Observations: Pt did not attend group.    Henlee Donovan, LRT/CTRS         Landyn Lorincz A 03/14/2017 12:12 PM 
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
## 2017-03-14 NOTE — Progress Notes (Signed)
[facility]  03/14/2017 12:22 PM Carolyna Algis DownsD Greaves  MRN:  [id number] Subjective:   Rebecca Lozano is a 36 y/o F with history of bipolar disorder who was admitted on IVC from [facility] where she was brought in by police after observed walking in street with disorganized behaviors, irritability, agitation, and SI. In ED pt was labile, irritable, disorganized, and refusing medications. She was be placed on PRN protocol for agitation, and she continued to refuse medications. Pt had ongoing agitation on the unit. Second opinion was provided by Dr. Jama Flavorsobos who agreed with plan for forced medications on 03/11/17, and pt was started on haldol 5mg  BID, which she has been taking in oral form. As per RN report, pt has been calming significantly, but she still has small episodes of agitation/yelling.  Today at interview, pt is irritable, pressured, and minimally cooperative. She continues to perseverate that she is pregnant, and when shown computer screen with negative pregnancy test from 12/22, pt doubts the validity and yells, "Look at this belly - this is a pregnancy belly!" Pt grew increasingly irritable and got up from the interview, but she was able to be redirected. She denies SI/HI/AH/VH. She demands change from haldol because of concern that she is being poisoned. She notes that she previously took geodon and had a period of stability, so she was in agreement to change haldol to geodon today. Pt also requested to be started on gabapentin as "a mood stabilizer." She also requested for vistaril to be increased to 50mg  dose, and this was verified as already increased on her medication list - pt states she would prefer to use combination of vistaril and gabapentin for anxiety rather than ativan which will be discontinued. She had no other questions, comments, or concerns at time of interview at conclusion of interview pt was calm and shook this provider's hand.    Principal Problem: Bipolar affective disorder,  current episode manic with psychotic symptoms (HCC) Diagnosis:   Patient Active Problem List   Diagnosis Date Noted  . Bipolar affective disorder, current episode manic with psychotic symptoms (HCC) [F31.2] 03/10/2017  . Psychosis (HCC) [F29] 03/09/2017   Total Time spent with patient: 30 minutes  Past Psychiatric History: see H&P  Past Medical History:  Past Medical History:  Diagnosis Date  . Bipolar 1 disorder (HCC)   . CHF (congestive heart failure) (HCC)   . GERD (gastroesophageal reflux disease)   . Hypertension   . Seizures (HCC)     Past Surgical History:  Procedure Laterality Date  . NO PAST SURGERIES     Family History: History reviewed. No pertinent family history. Family Psychiatric  History: see H&P Social History:  Social History   Substance and Sexual Activity  Alcohol Use No     Social History   Substance and Sexual Activity  Drug Use No    Social History   Socioeconomic History  . Marital status: Single    Spouse name: None  . Number of children: None  . Years of education: None  . Highest education level: None  Social Needs  . Financial resource strain: None  . Food insecurity - worry: None  . Food insecurity - inability: None  . Transportation needs - medical: None  . Transportation needs - non-medical: None  Occupational History  . None  Tobacco Use  . Smoking status: Current Every Day Smoker    Packs/day: 1.00    Types: Cigarettes, Cigars  . Smokeless tobacco: Never Used  Substance  and Sexual Activity  . Alcohol use: No  . Drug use: No  . Sexual activity: Not Currently  Other Topics Concern  . None  Social History Narrative  . None   Additional Social History:                         Sleep: Poor  Appetite:  Fair  Current Medications: Current Facility-Administered Medications  Medication Dose Route Frequency Provider Last Rate Last Dose  . acetaminophen (TYLENOL) tablet 650 mg  650 mg Oral Q6H PRN Micheal Likensain[place],  Haddon Fyfe T, MD   650 mg at 03/14/17 0110  . calcium carbonate (TUMS - dosed in mg elemental calcium) chewable tablet 200 mg of elemental calcium  1 tablet Oral BID PRN Micheal Likensainville, Lora Glomski T, MD   200 mg of elemental calcium at 03/14/17 1034  . gabapentin (NEURONTIN) capsule 100 mg  100 mg Oral BID Micheal Likensainville, Taiki Buckwalter T, MD      . guaiFENesin (MUCINEX) 12 hr tablet 600 mg  600 mg Oral BID Jolyne Loaainville, Lional Icenogle T, MD   600 mg at 03/14/17 1028  . haloperidol lactate (HALDOL) injection 5 mg  5 mg Intramuscular Q8H PRN Withrow, Everardo AllJohn C, FNP       And  . LORazepam (ATIVAN) injection 1 mg  1 mg Intramuscular Q8H PRN Withrow, Everardo AllJohn C, FNP      . hydrOXYzine (ATARAX/VISTARIL) tablet 50 mg  50 mg Oral Q6H PRN [name] ConnBerry, Jason A, NP      . LORazepam (ATIVAN) tablet 1 mg  1 mg Oral BID Beau FannyWithrow, John C, FNP   1 mg at 03/14/17 0744   Or  . LORazepam (ATIVAN) injection 1 mg  1 mg Intramuscular BID Withrow, John C, FNP      . multivitamin with minerals tablet 1 tablet  1 tablet Oral Daily Anton Cheramie, Burlene Arnthristopher T, MD      . traZODone (DESYREL) tablet 50 mg  50 mg Oral QHS,MR X 1 Kerry HoughSimon, Spencer E, PA-C   Stopped at 03/10/17 0151  . ziprasidone (GEODON) capsule 40 mg  40 mg Oral BID WC Micheal Likensainville, Elisandra Deshmukh T, MD        Lab Results: No results found for this or any previous visit (from the past 48 hour(s)).  Blood Alcohol level:  Lab Results  Component Value Date   ETH <10 03/05/2017   ETH (H) 08/19/2010    <11        LOWEST DETECTABLE LIMIT FOR SERUM ALCOHOL IS 5 mg/dL FOR MEDICAL PURPOSES ONLY    Metabolic Disorder Labs: No results found for: HGBA1C, MPG No results found for: PROLACTIN No results found for: CHOL, TRIG, HDL, CHOLHDL, VLDL, LDLCALC  Physical Findings: AIMS: Facial and Oral Movements Muscles of Facial Expression: None, normal Lips and Perioral Area: None, normal Jaw: None, normal Tongue: None, normal,Extremity Movements Upper (arms, wrists, hands, fingers): None,  normal Lower (legs, knees, ankles, toes): None, normal, Trunk Movements Neck, shoulders, hips: None, normal, Overall Severity Severity of abnormal movements (highest score from questions above): None, normal Incapacitation due to abnormal movements: None, normal Patient's awareness of abnormal movements (rate only patient's report): No Awareness, Dental Status Current problems with teeth and/or dentures?: No Does patient usually wear dentures?: No  CIWA:  CIWA-Ar Total: 4 COWS:     Musculoskeletal: Strength & Muscle Tone: within normal limits Gait & Station: normal Patient leans: N/A  Psychiatric Specialty Exam: Physical Exam  Nursing note reviewed.   Review of Systems  Constitutional:  Negative for chills and fever.  Respiratory: Negative for cough.   Cardiovascular: Negative for chest pain.  Gastrointestinal: Negative for heartburn and nausea.  Psychiatric/Behavioral: Negative for depression, hallucinations and suicidal ideas.    Blood pressure 134/76, pulse (!) 115, temperature 98.5 F (36.9 C), temperature source Oral, resp. rate 20, height 5\' 8"  (1.727 m), weight 108.9 kg (240 lb).Body mass index is 36.49 kg/m.  General Appearance: Casual and Fairly Groomed  Eye Contact:  Good  Speech:  Clear and Coherent and Normal Rate  Volume:  Normal  Mood:  Dysphoric and Irritable  Affect:  Blunt, Congruent and Labile  Thought Process:  Goal Directed and Descriptions of Associations: Loose  Orientation:  Full (Time, Place, and Person)  Thought Content:  Illogical, Delusions, Ideas of Reference:   Paranoia Delusions, Paranoid Ideation and Rumination  Suicidal Thoughts:  No  Homicidal Thoughts:  No  Memory:  Immediate;   Fair Recent;   Fair Remote;   Fair  Judgement:  Impaired  Insight:  Lacking  Psychomotor Activity:  Normal  Concentration:  Concentration: Fair  Recall:  [employer] of Knowledge:  Fair  Language:  Fair  Akathisia:  No  Handed:    AIMS (if indicated):      Assets:  Manufacturing systems engineer Physical Health Resilience Social Support  ADL's:  Intact  Cognition:  WNL  Sleep:  Number of Hours: 1     Treatment Plan Summary: Daily contact with patient to assess and evaluate symptoms and progress in treatment and Medication management. Pt continues to have symptoms of mania including irritability, flight of ideas, pressured speech, mood lability, distractibility, and delusions. She requests to change from haldol to geodon as it previously was helpful for her and she requests to start gabapentin as well. Ativan will be discontinued as pt will use vistaril and scheduled gabapentin for anxiety.    - Continue inpatient hospitalization (on IVC)  - Bipolar I current episode manic with psychotic features -Discontinue haldol 5mg  BID   - Discontinue ativan 1mg  BID    - Start geodon 40mg  po BID with meals    - Start gabapentin 100mg  BID    - Continue haldol 5mg  po/IM q8h prn agitation with ativan 1mg  po/IM q8h prn agitation  -Anxiety    - Continue atarax 50mg  po q6h prn anxiety  - Insomnia    - Continue trazodone 50mg  qhs prn insomnia    - Encourage participation in groups and therapeutic milieu   - Discharge planning will be ongoing    Micheal Likens, MD 03/14/2017, 12:22 PM

## 2017-03-14 NOTE — BHH Counselor (Signed)
CSW attempted to complete PSA.  Pt initially was cooperative but quickly became irritable and refused to answer any more questions. Garner NashGregory Jumaane Weatherford, MSW, LCSW Clinical Social Worker 03/14/2017 1:15 PM

## 2017-03-14 NOTE — Progress Notes (Signed)
Patient ID: Rebecca Lozano, female   DOB: Jan 12, 1981, 36 y.o.   MRN: [id number] D: Affect is appropriate to labile mood. Pt has been very loud and argumentative/demanding with staff and some with her peers on the unit requiring some redirection and was given PRN PO meds earlier this morning.She has refused or been unable to attend any of the groups or activities thus far today. A:Support and encouragement offered. R:Continues with mood lability and intrusive behavior.

## 2017-03-15 LAB — PREGNANCY, URINE: Preg Test, Ur: NEGATIVE

## 2017-03-15 MED ORDER — LORAZEPAM 1 MG PO TABS: 1.0000 mg | ORAL_TABLET | Freq: Four times a day (QID) | ORAL | Status: DC | PRN

## 2017-03-15 MED ORDER — LORAZEPAM 2 MG/ML IJ SOLN
1.0000 mg | Freq: Three times a day (TID) | INTRAMUSCULAR | Status: DC | PRN
  Administered 2017-03-21: 1 mg via INTRAMUSCULAR
  Filled 2017-03-15 (×4): qty 1

## 2017-03-15 MED ORDER — HALOPERIDOL 5 MG PO TABS: 5.0000 mg | ORAL_TABLET | Freq: Three times a day (TID) | ORAL | Status: DC | PRN

## 2017-03-15 MED ORDER — ZIPRASIDONE HCL 60 MG PO CAPS
60.0000 mg | ORAL_CAPSULE | Freq: Two times a day (BID) | ORAL | Status: DC
  Administered 2017-03-15 – 2017-03-17 (×4): 60 mg via ORAL
  Filled 2017-03-15: qty 1
  Filled 2017-03-15: qty 3
  Filled 2017-03-15 (×5): qty 1

## 2017-03-15 MED ORDER — HALOPERIDOL LACTATE 5 MG/ML IJ SOLN
5.0000 mg | Freq: Three times a day (TID) | INTRAMUSCULAR | Status: DC | PRN
  Administered 2017-03-21: 5 mg via INTRAMUSCULAR
  Filled 2017-03-15: qty 1
  Filled 2017-03-15: qty 2
  Filled 2017-03-15: qty 1

## 2017-03-15 MED ORDER — GABAPENTIN 300 MG PO CAPS
300.0000 mg | ORAL_CAPSULE | Freq: Two times a day (BID) | ORAL | Status: DC
  Administered 2017-03-15 – 2017-03-21 (×12): 300 mg via ORAL
  Filled 2017-03-15 (×17): qty 1

## 2017-03-15 NOTE — Progress Notes (Signed)
[facility] MD Progress Note  03/15/2017 5:43 PM Rebecca Lozano  MRN:  [id number] Subjective:   Rebecca Lozano is a 37 y/o F with history of bipolar disorder who was admitted on IVC from [facility] where she was brought in by police after observed walking in street with disorganized behaviors, irritability, agitation, and SI.In ED pt waslabile, irritable, disorganized, and refusing medications. She wasbe placed on PRN protocol for agitation, and she continued to refuse medications. Pt had ongoing agitation on the unit. Second opinion was provided by Dr. Jama Flavors who agreed with plan for forced medications on 03/11/17, and pt was started on haldol 5mg  BID. Yesterday, pt was in agreement to change to scheduled geodon as she voiced that was a helpful medication for her in the past.  Today at interview, pt has some ongoing irritability, but she demonstrates significant improvement overall in hear ability to engage in the interview. Pt was asked how she is doing today (as she was returning from lunch), and she replied, "I'm full - it was a good lunch." Pt denies SI/HI/AH/VH. She reports that she is sleeping adequately, but RN staff only recorded 3.25 hours of sleep. Though patient is irritable during interview, she is labile in affect and becomes tearful multiple times during interview.  Discussed with patient about treatment options. She agrees to increase dose of geodon today as well as increase dose of gabapentin to address her ongoing anxiety and chronic pain symptoms. Pt was in agreement with these changes to her treatment plan, and she had no further questions, comments, or concerns.   Principal Problem: Bipolar affective disorder, current episode manic with psychotic symptoms (HCC) Diagnosis:   Patient Active Problem List   Diagnosis Date Noted  . Bipolar affective disorder, current episode manic with psychotic symptoms (HCC) [F31.2] 03/10/2017  . Psychosis (HCC) [F29] 03/09/2017   Total Time spent with patient:  30 minutes  Past Psychiatric History: see H&P  Past Medical History:  Past Medical History:  Diagnosis Date  . Bipolar 1 disorder (HCC)   . CHF (congestive heart failure) (HCC)   . GERD (gastroesophageal reflux disease)   . Hypertension   . Seizures (HCC)     Past Surgical History:  Procedure Laterality Date  . NO PAST SURGERIES     Family History: History reviewed. No pertinent family history. Family Psychiatric  History: see H&P Social History:  Social History   Substance and Sexual Activity  Alcohol Use No     Social History   Substance and Sexual Activity  Drug Use No    Social History   Socioeconomic History  . Marital status: Single    Spouse name: None  . Number of children: None  . Years of education: None  . Highest education level: None  Social Needs  . Financial resource strain: None  . Food insecurity - worry: None  . Food insecurity - inability: None  . Transportation needs - medical: None  . Transportation needs - non-medical: None  Occupational History  . None  Tobacco Use  . Smoking status: Current Every Day Smoker    Packs/day: 1.00    Types: Cigarettes, Cigars  . Smokeless tobacco: Never Used  Substance and Sexual Activity  . Alcohol use: No  . Drug use: No  . Sexual activity: Not Currently  Other Topics Concern  . None  Social History Narrative  . None   Additional Social History:  Sleep: Poor  Appetite:  Fair  Current Medications: Current Facility-Administered Medications  Medication Dose Route Frequency Provider Last Rate Last Dose  . acetaminophen (TYLENOL) tablet 650 mg  650 mg Oral Q6H PRN Micheal Likensainville, Mykela Mewborn T, MD   650 mg at 03/15/17 0807  . calcium carbonate (TUMS - dosed in mg elemental calcium) chewable tablet 200 mg of elemental calcium  1 tablet Oral BID PRN Micheal Likens[place], Briar Sword T, MD   200 mg of elemental calcium at 03/14/17 1659  . gabapentin (NEURONTIN) capsule 300 mg  300  mg Oral BID Jolyne Loaainville, Torrance Stockley T, MD      . guaiFENesin (MUCINEX) 12 hr tablet 600 mg  600 mg Oral BID Jolyne Loaainville, Rollan Roger T, MD   600 mg at 03/15/17 [id number]  . haloperidol (HALDOL) tablet 5 mg  5 mg Oral Q8H PRN Micheal Likens[place], Osmin Welz T, MD      . haloperidol lactate (HALDOL) injection 5 mg  5 mg Intramuscular Q8H PRN Micheal Lik[place], Wilmer Berryhill T, MD       And  . LORazepam (ATIVAN) injection 1 mg  1 mg Intramuscular Q8H PRN Micheal Likensainville, Ranald Alessio T, MD      . hydrOXYzine (ATARAX/VISTARIL) tablet 50 mg  50 mg Oral Q6H PRN [name] ConnBerry, Jason A, NP   50 mg at 03/14/17 1410  . multivitamin with minerals tablet 1 tablet  1 tablet Oral Daily Micheal Likens[place], Meiko Stranahan T, MD   1 tablet at 03/15/17 0807  . traZODone (DESYREL) tablet 50 mg  50 mg Oral QHS,MR X 1 Kerry HoughSimon, Spencer E, PA-C   Stopped at 03/10/17 0151  . ziprasidone (GEODON) capsule 60 mg  60 mg Oral BID WC Micheal Likensainville, Yehia Mcbain T, MD        Lab Results: No results found for this or any previous visit (from the past 48 hour(s)).  Blood Alcohol level:  Lab Results  Component Value Date   ETH <10 03/05/2017   ETH (H) 08/19/2010    <11        LOWEST DETECTABLE LIMIT FOR SERUM ALCOHOL IS 5 mg/dL FOR MEDICAL PURPOSES ONLY    Metabolic Disorder Labs: No results found for: HGBA1C, MPG No results found for: PROLACTIN No results found for: CHOL, TRIG, HDL, CHOLHDL, VLDL, LDLCALC  Physical Findings: AIMS: Facial and Oral Movements Muscles of Facial Expression: None, normal Lips and Perioral Area: None, normal Jaw: None, normal Tongue: None, normal,Extremity Movements Upper (arms, wrists, hands, fingers): None, normal Lower (legs, knees, ankles, toes): None, normal, Trunk Movements Neck, shoulders, hips: None, normal, Overall Severity Severity of abnormal movements (highest score from questions above): None, normal Incapacitation due to abnormal movements: None, normal Patient's awareness of abnormal movements (rate only patient's report): No  Awareness, Dental Status Current problems with teeth and/or dentures?: No Does patient usually wear dentures?: No  CIWA:  CIWA-Ar Total: 4 COWS:     Musculoskeletal: Strength & Muscle Tone: within normal limits Gait & Station: normal Patient leans: N/A  Psychiatric Specialty Exam: Physical Exam  Nursing note and vitals reviewed.   Review of Systems  Constitutional: Negative for chills and fever.  Respiratory: Negative for cough and shortness of breath.   Cardiovascular: Negative for chest pain.  Gastrointestinal: Negative for abdominal pain, heartburn, nausea and vomiting.  Psychiatric/Behavioral: Negative for depression, hallucinations and suicidal ideas. The patient is not nervous/anxious.     Blood pressure (!) 144/89, pulse (!) 102, temperature 98.5 F (36.9 C), temperature source Oral, resp. rate 20, height 5\' 8"  (1.727 m), weight 108.9 kg (240 lb).Body mass index  is 36.49 kg/m.  General Appearance: Casual and Fairly Groomed  Eye Contact:  Good  Speech:  Clear and Coherent and Normal Rate  Volume:  Normal  Mood:  Angry and Irritable  Affect:  Congruent, Constricted, Labile and Tearful  Thought Process:  Coherent and Goal Directed  Orientation:  Full (Time, Place, and Person)  Thought Content:  Delusions, Ideas of Reference:   Paranoia Delusions and Paranoid Ideation  Suicidal Thoughts:  No  Homicidal Thoughts:  No  Memory:  Immediate;   Good Recent;   Good Remote;   Good  Judgement:  Poor  Insight:  Lacking  Psychomotor Activity:  Normal  Concentration:  Concentration: Fair  Recall:  [employer] of Knowledge:  Fair  Language:  Fair  Akathisia:  No  Handed:    AIMS (if indicated):     Assets:  Manufacturing systems engineer Physical Health Resilience Social Support  ADL's:  Intact  Cognition:  WNL  Sleep:  Number of Hours: 3.25     Treatment Plan Summary: Daily contact with patient to assess and evaluate symptoms and progress in treatment and Medication  management. Pt has improvement of her symptoms with starting medications, but she has some ongoing irritability and symptoms of mania. She agrees to increase dose of geodon and gabapentin today.  -Continue inpatient hospitalization (on IVC)  - Bipolar I current episode manic with psychotic features              - Change geodon 40mg  po BID with meals to geodon 60mg  po BID with meals               - Change gabapentin 100mg  BID to gabapentin 300mg  BID                - Continue haldol 5mg  po/IM q8h prn agitation with ativan 1mg  po/IM q8h prn agitation  -Anxiety                         - Continue atarax 50mg  po q6h prn anxiety  - Insomnia                         - Continue trazodone 50mg  qhs prn insomnia              - Encourage participation in groups and therapeutic milieu   - Discharge planning will be ongoing    Micheal Likens, MD 03/15/2017, 5:43 PM

## 2017-03-15 NOTE — Plan of Care (Signed)
Pt continues to be hyperverbal and intrusive on the unit and during group. Pt requires much redirection.
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
## 2017-03-15 NOTE — Progress Notes (Signed)
D:Pt has been irritable and preoccupied yelling and talking to herself. Pt was heard talking and arguing in her room with no one in her room. Pt would not take medication from this writer.  A:Offered support and 15 minute checks. R:Pt remains safe on the unit.
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
## 2017-03-16 MED ORDER — ZIPRASIDONE MESYLATE 20 MG IM SOLR
20.0000 mg | Freq: Once | INTRAMUSCULAR | Status: AC
  Administered 2017-03-16: 20 mg via INTRAMUSCULAR
  Filled 2017-03-16: qty 20

## 2017-03-16 MED ORDER — ZIPRASIDONE MESYLATE 20 MG IM SOLR
INTRAMUSCULAR | Status: AC
  Filled 2017-03-16: qty 20

## 2017-03-16 MED ORDER — LORAZEPAM 2 MG/ML IJ SOLN
2.0000 mg | Freq: Once | INTRAMUSCULAR | Status: AC
  Administered 2017-03-16: 2 mg via INTRAVENOUS

## 2017-03-16 NOTE — Progress Notes (Signed)
Recreation Therapy Notes  Date: 03/16/17 Time: 1000 Location: 500 Hall Dayroom  Group Topic: Coping Skills  Goal Area(s) Addresses:  Patient will be able to identify positive coping skills. Patient will be able to identify the benefits of using coping skills. Patient will be able to identify benefits of using coping skills post d/c.  Intervention: Worksheet  Activity: Mind map.  Patients were given a blank mind map.  Patients and LRT filled in the first eight boxes together with anger, anxiety, depression, loneliness, hopelessness, family/friends, work and addiction.  Patients were to then come up with at least three coping skills for each area identified.  The group would then reconvene and LRT would fill in coping skills on the board.  Education: PharmacologistCoping Skills, Building control surveyorDischarge Planning.   Education Outcome: Acknowledges understanding/In group clarification offered/Needs additional education.   Clinical Observations/Feedback: Pt did not attend group.      Caroll RancherMarjette Mecca Barga, LRT/CTRS         Caroll RancherLindsay, Shayli Altemose A 03/16/2017 1:44 PM

## 2017-03-16 NOTE — Progress Notes (Signed)
Patient ID: Rebecca Lozano, female   DOB: Jul 04, 1980, 37 y.o.   MRN: [id number]   D: Patient guarded on approach tonight. Not wanting undersigned to speak with her but no high degree of agitation. She exhibits some paranoia when she was asking for hygiene items and multiple staff attempting to get all the things. She would say she didn't want all of our hands touching the items we were giving her. Patient seen talking to self. Talking to other nurse about being put back on Geodon.  A: Staff will continue to monitor on q 15 minute checks, follow treatment plan, and give meds as ordered. R: Calm at present

## 2017-03-16 NOTE — Progress Notes (Signed)
At approximately noon, this writer went to the 500 hall in response to the loud voice of the patient, who was yelling and cursing. She was berating and threatening staff. This Clinical research associatewriter was Scientist, research (physical sciences)assisting primary RN in drawing up haldol and Ativan IMs, which were wasted when MD changed med orders. He then ordered Ativan and Geodon IM. This writer drew up 2 mg Ativan IM under the Pyxis 1 mg Ativan IM order, so there was not any medication left to waste. As a result, there is an undocumented waste in the Pyxis.

## 2017-03-16 NOTE — Progress Notes (Signed)
[facility]  03/16/2017 3:26 PM Rebecca Lozano  MRN:  [id number]  Subjective: Rebecca Lozano reports, "I want to know why I'm not allowed to go outside & to the cafeteria to eat. Why am I on unit restriction?"  Objective: Rebecca Lozano is a 37 y/o F with history of bipolar disorder who was admitted on IVC from [facility] where she was brought in by police after observed walking in street with disorganized behaviors, irritability, agitation, and SI.In ED pt waslabile, irritable, disorganized, and refusing medications. She wasbe placed on PRN protocol for agitation, and shecontinued to refuse medications. Pt had ongoing agitation on the unit. Second opinion was provided by Dr. Jama Flavorsobos who agreed with plan for forced medications on 03/11/17, and pt was started on haldol 5mg  BID. Yesterday, pt was in agreement to change to scheduled geodon as she voiced that was a helpful medication for her in the past.  Today, 03-16-17. Patient is seen, chart reviewed. Chart findings discussed with the treatment team.Patient is seen walking around on the unit hallway. Upon evaluation; She present agitated, irritated, manic & verbally aggressive. Overall, she engages in the this follow-up evaluation aggressively.  Pt was asked how she is doing? She replied "I'm fine, how are you doing yourself? She becomes very aggressive towards the staff, calling them bitches. She denies SI/HI/AH/VH. The staff notes that she is not sleeping well at night. Though patient is irritable during interview, there have been times whereby she was noted having mood lability. The attending psychiatrist had discussed with patient about treatment options. She agrees to increase dose of geodon yesterday as well as increase dose of gabapentin to address her ongoing anxiety and chronic pain symptoms. Pt was in agreement with these changes to her treatment plan, and she had no further questions, comments, or concerns. She continues to require & receive the  agitation protocols due to frequent outburst & disruption on the unit.  Principal Problem: Bipolar affective disorder, current episode manic with psychotic symptoms (HCC)  Diagnosis:       Patient Active Problem List   Diagnosis Date Noted  . Bipolar affective disorder, current episode manic with psychotic symptoms (HCC) [F31.2] 03/10/2017  . Psychosis (HCC) [F29] 03/09/2017   Total Time spent with patient: 30 minutes  Past Psychiatric History: See H&P  Past Medical History:      Past Medical History:  Diagnosis Date  . Bipolar 1 disorder (HCC)   . CHF (congestive heart failure) (HCC)   . GERD (gastroesophageal reflux disease)   . Hypertension   . Seizures (HCC)          Past Surgical History:  Procedure Laterality Date  . NO PAST SURGERIES     Family History: History reviewed. No pertinent family history.  Family Psychiatric  History: See H&P  Social History:  Social History      Substance and Sexual Activity  Alcohol Use No     Social History      Substance and Sexual Activity  Drug Use No    Social History        Socioeconomic History  . Marital status: Single    Spouse name: None  . Number of children: None  . Years of education: None  . Highest education level: None  Social Needs  . Financial resource strain: None  . Food insecurity - worry: None  . Food insecurity - inability: None  . Transportation needs - medical: None  . Transportation needs - non-medical: None  Occupational  History  . None  Tobacco Use  . Smoking status: Current Every Day Smoker    Packs/day: 1.00    Types: Cigarettes, Cigars  . Smokeless tobacco: Never Used  Substance and Sexual Activity  . Alcohol use: No  . Drug use: No  . Sexual activity: Not Currently  Other Topics Concern  . None  Social History Narrative  . None   Additional Social History:   Sleep: Poor  Appetite:  Fair  Current Medications:          Current  Facility-Administered Medications  Medication Dose Route Frequency Provider Last Rate Last Dose  . acetaminophen (TYLENOL) tablet 650 mg  650 mg Oral Q6H PRN Micheal Likens, MD   650 mg at 03/16/17 0948  . calcium carbonate (TUMS - dosed in mg elemental calcium) chewable tablet 200 mg of elemental calcium  1 tablet Oral BID PRN Micheal Likens, MD   200 mg of elemental calcium at 03/16/17 0825  . gabapentin (NEURONTIN) capsule 300 mg  300 mg Oral BID Micheal Likens, MD   300 mg at 03/16/17 0818  . guaiFENesin (MUCINEX) 12 hr tablet 600 mg  600 mg Oral BID Jolyne Loa T, MD   600 mg at 03/16/17 0818  . haloperidol (HALDOL) tablet 5 mg  5 mg Oral Q8H PRN Micheal Likens, MD      . haloperidol lactate (HALDOL) injection 5 mg  5 mg Intramuscular Q8H PRN Micheal Likens, MD       And  . LORazepam (ATIVAN) injection 1 mg  1 mg Intramuscular Q8H PRN Micheal Likens, MD      . hydrOXYzine (ATARAX/VISTARIL) tablet 50 mg  50 mg Oral Q6H PRN [name] Conn A, NP   50 mg at 03/16/17 0948  . LORazepam (ATIVAN) tablet 1 mg  1 mg Oral Q6H PRN Kerry Hough, PA-C      . multivitamin with minerals tablet 1 tablet  1 tablet Oral Daily Micheal Likens, MD   1 tablet at 03/16/17 0818  . traZODone (DESYREL) tablet 50 mg  50 mg Oral QHS,MR X 1 Kerry Hough, PA-C   Stopped at 03/10/17 0151  . ziprasidone (GEODON) 20 MG injection           . ziprasidone (GEODON) capsule 60 mg  60 mg Oral BID WC Micheal Likens, MD   60 mg at 03/16/17 0818   Lab Results:  LabResultsLast48Hours        Results for orders placed or performed during the hospital encounter of 03/09/17 (from the past 48 hour(s))  Pregnancy, urine     Status: None   Collection Time: 03/15/17 12:04 PM  Result Value Ref Range   Preg Test, Ur NEGATIVE NEGATIVE    Comment:        THE SENSITIVITY OF THIS METHODOLOGY IS >20 mIU/mL. Performed at [facility], [address], [place], [place] [address]      Blood Alcohol level:  RecentLabs        Lab Results  Component Value Date   ETH <10 03/05/2017   ETH (H) 08/19/2010    <11        LOWEST DETECTABLE LIMIT FOR SERUM ALCOHOL IS 5 mg/dL FOR MEDICAL PURPOSES ONLY     Metabolic Disorder Labs: RecentLabs  No results found for: HGBA1C, MPG   RecentLabs  No results found for: PROLACTIN   RecentLabs  No results found for: CHOL, TRIG, HDL, CHOLHDL,  VLDL, LDLCALC    Physical Findings: AIMS: Facial and Oral Movements Muscles of Facial Expression: None, normal Lips and Perioral Area: None, normal Jaw: None, normal Tongue: None, normal,Extremity Movements Upper (arms, wrists, hands, fingers): None, normal Lower (legs, knees, ankles, toes): None, normal, Trunk Movements Neck, shoulders, hips: None, normal, Overall Severity Severity of abnormal movements (highest score from questions above): None, normal Incapacitation due to abnormal movements: None, normal Patient's awareness of abnormal movements (rate only patient's report): No Awareness, Dental Status Current problems with teeth and/or dentures?: No Does patient usually wear dentures?: No  CIWA:  CIWA-Ar Total: 4 COWS:     Musculoskeletal: Strength & Muscle Tone: within normal limits Gait & Station: normal Patient leans: N/A  Psychiatric Specialty Exam: Physical Exam  Nursing note and vitals reviewed.   Review of Systems  Constitutional: Negative for chills and fever.  Respiratory: Negative for cough and shortness of breath.   Cardiovascular: Negative for chest pain.  Gastrointestinal: Negative for abdominal pain, heartburn, nausea and vomiting.  Psychiatric/Behavioral: Negative for depression, hallucinations and suicidal ideas. The patient is not nervous/anxious.     Blood pressure (!) 144/89, pulse (!) 102, temperature 98.5 F (36.9 C), temperature source Oral, resp. rate 20,  height 5\' 8"  (1.727 m), weight 108.9 kg (240 lb).Body mass index is 36.49 kg/m.  General Appearance: Casual and Fairly Groomed  Eye Contact:  Good  Speech:  Clear and Coherent and Normal Rate  Volume:  Normal  Mood:  Angry and Irritable  Affect:  Congruent, Constricted, Labile and Tearful  Thought Process:  Coherent and Goal Directed  Orientation:  Full (Time, Place, and Person)  Thought Content:  Delusions, Ideas of Reference:   Paranoia Delusions and Paranoid Ideation  Suicidal Thoughts:  No  Homicidal Thoughts:  No  Memory:  Immediate;   Good Recent;   Good Remote;   Good  Judgement:  Poor  Insight:  Lacking  Psychomotor Activity:  Normal  Concentration:  Concentration: Fair  Recall:  [employer] of Knowledge:  Fair  Language:  Fair  Akathisia:  No  Handed:    AIMS (if indicated):     Assets:  Manufacturing systems engineer Physical Health Resilience Social Support  ADL's:  Intact  Cognition:  WNL  Sleep:  Number of Hours: 3.25   Treatment Plan Summary: Daily contact with patient to assess and evaluate symptoms and progress in treatment and Medication management. Pt has some partial improvement of her symptoms with starting medications, but she has some ongoing irritability and symptoms of mania. She was in agreement with the attending psychiatrist to increase her dose of geodon and gabapentin yesterday.  -Continue inpatient hospitalization (on IVC).  Will continue today 03/16/2017 plan as below except where it is noted.  - Bipolar I current episode manic with psychotic features - Continue geodon 60mg  po BID with meals - Continue gabapentin 300mg  BID - Continue haldol 5mg  po/IM q8h prn agitation with ativan 1mg  po/IM q8h prn agitation  -Anxiety - Continue atarax 50mg  po q6h prn anxiety  - Insomnia - Continue trazodone 50mg  qhs prn insomnia  - Encourage participation  in groups and therapeutic milieu   - Discharge planning will be ongoing  Armandina Stammer, NP, PMHNP, FNP-BC. 03/16/2017, 3:26 PM           Revision History

## 2017-03-16 NOTE — Discharge Summary (Deleted)
[facility]  03/16/2017 3:26 PM Rebecca Lozano  MRN:  [id number]  Subjective: Rebecca Lozano reports, "I want to know why I'm not allowed to go outside & to the cafeteria to eat. Why am I on unit restriction?"  Objective: Rebecca Lozano is a 37 y/o F with history of bipolar disorder who was admitted on IVC from [facility] where she was brought in by police after observed walking in street with disorganized behaviors, irritability, agitation, and SI.In ED pt waslabile, irritable, disorganized, and refusing medications. She wasbe placed on PRN protocol for agitation, and she continued to refuse medications. Pt had ongoing agitation on the unit. Second opinion was provided by Dr. Jama Flavorsobos who agreed with plan for forced medications on 03/11/17, and pt was started on haldol 5mg  BID. Yesterday, pt was in agreement to change to scheduled geodon as she voiced that was a helpful medication for her in the past.  Today, 03-16-17. Patient is seen, chart reviewed. Chart findings discussed with the treatment team. Patient is seen walking around on the unit hallway. Upon evaluation; She present agitated, irritated, manic & verbally aggressive. Overall, she engages in the this follow-up evaluation aggressively.  Pt was asked how she is doing? She replied "I'm fine, how are you doing yourself? She becomes very aggressive towards the staff, calling them bitches. She denies SI/HI/AH/VH. The staff notes that she is not sleeping well at night. Though patient is irritable during interview, there have been times whereby she was noted having mood lability. The attending psychiatrist had discussed with patient about treatment options. She agrees to increase dose of geodon yesterday as well as increase dose of gabapentin to address her ongoing anxiety and chronic pain symptoms. Pt was in agreement with these changes to her treatment plan, and she had no further questions, comments, or concerns. She continues to require & receive the agitation  protocols due to frequent outburst & disruption on the unit.  Principal Problem: Bipolar affective disorder, current episode manic with psychotic symptoms (HCC)  Diagnosis:   Patient Active Problem List   Diagnosis Date Noted  . Bipolar affective disorder, current episode manic with psychotic symptoms (HCC) [F31.2] 03/10/2017  . Psychosis (HCC) [F29] 03/09/2017   Total Time spent with patient: 30 minutes  Past Psychiatric History: See H&P  Past Medical History:  Past Medical History:  Diagnosis Date  . Bipolar 1 disorder (HCC)   . CHF (congestive heart failure) (HCC)   . GERD (gastroesophageal reflux disease)   . Hypertension   . Seizures (HCC)     Past Surgical History:  Procedure Laterality Date  . NO PAST SURGERIES     Family History: History reviewed. No pertinent family history.  Family Psychiatric  History: See H&P  Social History:  Social History   Substance and Sexual Activity  Alcohol Use No     Social History   Substance and Sexual Activity  Drug Use No    Social History   Socioeconomic History  . Marital status: Single    Spouse name: None  . Number of children: None  . Years of education: None  . Highest education level: None  Social Needs  . Financial resource strain: None  . Food insecurity - worry: None  . Food insecurity - inability: None  . Transportation needs - medical: None  . Transportation needs - non-medical: None  Occupational History  . None  Tobacco Use  . Smoking status: Current Every Day Smoker    Packs/day: 1.00  Types: Cigarettes, Cigars  . Smokeless tobacco: Never Used  Substance and Sexual Activity  . Alcohol use: No  . Drug use: No  . Sexual activity: Not Currently  Other Topics Concern  . None  Social History Narrative  . None   Additional Social History:   Sleep: Poor  Appetite:  Fair  Current Medications: Current Facility-Administered Medications  Medication Dose Route Frequency Provider Last Rate  Last Dose  . acetaminophen (TYLENOL) tablet 650 mg  650 mg Oral Q6H PRN Micheal Likens, MD   650 mg at 03/16/17 0948  . calcium carbonate (TUMS - dosed in mg elemental calcium) chewable tablet 200 mg of elemental calcium  1 tablet Oral BID PRN Micheal Likens, MD   200 mg of elemental calcium at 03/16/17 0825  . gabapentin (NEURONTIN) capsule 300 mg  300 mg Oral BID Micheal Likens, MD   300 mg at 03/16/17 0818  . guaiFENesin (MUCINEX) 12 hr tablet 600 mg  600 mg Oral BID Jolyne Loa T, MD   600 mg at 03/16/17 0818  . haloperidol (HALDOL) tablet 5 mg  5 mg Oral Q8H PRN Micheal Likens, MD      . haloperidol lactate (HALDOL) injection 5 mg  5 mg Intramuscular Q8H PRN Micheal Likens, MD       And  . LORazepam (ATIVAN) injection 1 mg  1 mg Intramuscular Q8H PRN Micheal Likens, MD      . hydrOXYzine (ATARAX/VISTARIL) tablet 50 mg  50 mg Oral Q6H PRN [name] Conn A, NP   50 mg at 03/16/17 0948  . LORazepam (ATIVAN) tablet 1 mg  1 mg Oral Q6H PRN Kerry Hough, PA-C      . multivitamin with minerals tablet 1 tablet  1 tablet Oral Daily Micheal Likens, MD   1 tablet at 03/16/17 0818  . traZODone (DESYREL) tablet 50 mg  50 mg Oral QHS,MR X 1 Kerry Hough, PA-C   Stopped at 03/10/17 0151  . ziprasidone (GEODON) 20 MG injection           . ziprasidone (GEODON) capsule 60 mg  60 mg Oral BID WC Micheal Likens, MD   60 mg at 03/16/17 0818   Lab Results:  Results for orders placed or performed during the hospital encounter of 03/09/17 (from the past 48 hour(s))  Pregnancy, urine     Status: None   Collection Time: 03/15/17 12:04 PM  Result Value Ref Range   Preg Test, Ur NEGATIVE NEGATIVE    Comment:        THE SENSITIVITY OF THIS METHODOLOGY IS >20 mIU/mL. Performed at [facility], [address], [place], [place] [address]    Blood Alcohol level:  Lab Results  Component Value Date    ETH <10 03/05/2017   ETH (H) 08/19/2010    <11        LOWEST DETECTABLE LIMIT FOR SERUM ALCOHOL IS 5 mg/dL FOR MEDICAL PURPOSES ONLY   Metabolic Disorder Labs: No results found for: HGBA1C, MPG No results found for: PROLACTIN No results found for: CHOL, TRIG, HDL, CHOLHDL, VLDL, LDLCALC  Physical Findings: AIMS: Facial and Oral Movements Muscles of Facial Expression: None, normal Lips and Perioral Area: None, normal Jaw: None, normal Tongue: None, normal,Extremity Movements Upper (arms, wrists, hands, fingers): None, normal Lower (legs, knees, ankles, toes): None, normal, Trunk Movements Neck, shoulders, hips: None, normal, Overall Severity Severity of abnormal movements (highest score from questions above): None,  normal Incapacitation due to abnormal movements: None, normal Patient's awareness of abnormal movements (rate only patient's report): No Awareness, Dental Status Current problems with teeth and/or dentures?: No Does patient usually wear dentures?: No  CIWA:  CIWA-Ar Total: 4 COWS:     Musculoskeletal: Strength & Muscle Tone: within normal limits Gait & Station: normal Patient leans: N/A  Psychiatric Specialty Exam: Physical Exam  Nursing note and vitals reviewed.   Review of Systems  Constitutional: Negative for chills and fever.  Respiratory: Negative for cough and shortness of breath.   Cardiovascular: Negative for chest pain.  Gastrointestinal: Negative for abdominal pain, heartburn, nausea and vomiting.  Psychiatric/Behavioral: Negative for depression, hallucinations and suicidal ideas. The patient is not nervous/anxious.     Blood pressure (!) 144/89, pulse (!) 102, temperature 98.5 F (36.9 C), temperature source Oral, resp. rate 20, height 5\' 8"  (1.727 m), weight 108.9 kg (240 lb).Body mass index is 36.49 kg/m.  General Appearance: Casual and Fairly Groomed  Eye Contact:  Good  Speech:  Clear and Coherent and Normal Rate  Volume:  Normal  Mood:   Angry and Irritable  Affect:  Congruent, Constricted, Labile and Tearful  Thought Process:  Coherent and Goal Directed  Orientation:  Full (Time, Place, and Person)  Thought Content:  Delusions, Ideas of Reference:   Paranoia Delusions and Paranoid Ideation  Suicidal Thoughts:  No  Homicidal Thoughts:  No  Memory:  Immediate;   Good Recent;   Good Remote;   Good  Judgement:  Poor  Insight:  Lacking  Psychomotor Activity:  Normal  Concentration:  Concentration: Fair  Recall:  [employer] of Knowledge:  Fair  Language:  Fair  Akathisia:  No  Handed:    AIMS (if indicated):     Assets:  Manufacturing systems engineer Physical Health Resilience Social Support  ADL's:  Intact  Cognition:  WNL  Sleep:  Number of Hours: 3.25   Treatment Plan Summary: Daily contact with patient to assess and evaluate symptoms and progress in treatment and Medication management. Pt has some partial improvement of her symptoms with starting medications, but she has some ongoing irritability and symptoms of mania. She was in agreement with the attending psychiatrist to increase her dose of geodon and gabapentin yesterday.  -Continue inpatient hospitalization (on IVC).  Will continue today 03/16/2017 plan as below except where it is noted.  - Bipolar I current episode manic with psychotic features              - Continue geodon 60mg  po BID with meals               - Continue gabapentin 300mg  BID                - Continue haldol 5mg  po/IM q8h prn agitation with ativan 1mg  po/IM q8h prn agitation  -Anxiety                         - Continue atarax 50mg  po q6h prn anxiety  - Insomnia                         - Continue trazodone 50mg  qhs prn insomnia             - Encourage participation in groups and therapeutic milieu   - Discharge planning will be ongoing  Armandina Stammer, NP, PMHNP, FNP-BC. 03/16/2017, 3:26 PM

## 2017-03-16 NOTE — [facility] Group Notes (Signed)
LCSW Group Therapy Note   03/16/2017 1:15pm   Type of Therapy and Topic:  Group Therapy:  Overcoming Obstacles   Participation Level:  Did Not Attend   Description of Group:    In this group patients will be encouraged to explore what they see as obstacles to their own wellness and recovery. They will be guided to discuss their thoughts, feelings, and behaviors related to these obstacles. The group will process together ways to cope with barriers, with attention given to specific choices patients can make. Each patient will be challenged to identify changes they are motivated to make in order to overcome their obstacles. This group will be process-oriented, with patients participating in exploration of their own experiences as well as giving and receiving support and challenge from other group members.   Therapeutic Goals: 1. Patient will identify personal and current obstacles as they relate to admission. 2. Patient will identify barriers that currently interfere with their wellness or overcoming obstacles.  3. Patient will identify feelings, thought process and behaviors related to these barriers. 4. Patient will identify two changes they are willing to make to overcome these obstacles:      Summary of Patient Progress      Therapeutic Modalities:   Cognitive Behavioral Therapy Solution Focused Therapy Motivational Interviewing Relapse Prevention Therapy  Rebecca RogueRodney B Jejuan Scala, LCSW 03/16/2017 3:22 PM

## 2017-03-16 NOTE — Progress Notes (Signed)
Patient was threatening staff on the unit patient stated to nurse "Do you want me to assault you with my words and my hands bitch?  Fuck you bitch!"  Patient was redirected but unable to follow redirection and her behaviors began to escalate.  Patient continued to be verbally assaulting toward staff and continued to threaten.  Patient was offered PO prn medications but refused and continued with threatening behaviors.  Patient was then offered IM medications due to agitation protocol.   Assess patient for safety, offer medications as prescribed, engage patient in 1:1 staff talks.   Patient needed additional support to maintain safety. Continue to monitor as planned.

## 2017-03-16 NOTE — Tx Team (Signed)
Interdisciplinary Treatment and Diagnostic Plan Update  03/16/2017 Time of Session: 9:00am  Rebecca Lozano MRN: [id number]  Principal Diagnosis: Bipolar affective disorder, current episode manic with psychotic symptoms (HCC)  Secondary Diagnoses: Principal Problem:   Bipolar affective disorder, current episode manic with psychotic symptoms (HCC) Active Problems:   Psychosis (HCC)   Current Medications:  Current Facility-Administered Medications  Medication Dose Route Frequency Provider Last Rate Last Dose  . acetaminophen (TYLENOL) tablet 650 mg  650 mg Oral Q6H PRN Micheal Likens, MD   650 mg at 03/15/17 0807  . calcium carbonate (TUMS - dosed in mg elemental calcium) chewable tablet 200 mg of elemental calcium  1 tablet Oral BID PRN Micheal Likens, MD   200 mg of elemental calcium at 03/15/17 1820  . gabapentin (NEURONTIN) capsule 300 mg  300 mg Oral BID Micheal Likens, MD   300 mg at 03/16/17 0818  . guaiFENesin (MUCINEX) 12 hr tablet 600 mg  600 mg Oral BID Jolyne Loa T, MD   600 mg at 03/16/17 0818  . haloperidol (HALDOL) tablet 5 mg  5 mg Oral Q8H PRN Micheal Likens, MD      . haloperidol lactate (HALDOL) injection 5 mg  5 mg Intramuscular Q8H PRN Micheal Likens, MD       And  . LORazepam (ATIVAN) injection 1 mg  1 mg Intramuscular Q8H PRN Micheal Likens, MD      . hydrOXYzine (ATARAX/VISTARIL) tablet 50 mg  50 mg Oral Q6H PRN [name] Conn A, NP   50 mg at 03/14/17 1410  . LORazepam (ATIVAN) tablet 1 mg  1 mg Oral Q6H PRN Kerry Hough, PA-C      . multivitamin with minerals tablet 1 tablet  1 tablet Oral Daily Micheal Likens, MD   1 tablet at 03/16/17 0818  . traZODone (DESYREL) tablet 50 mg  50 mg Oral QHS,MR X 1 Kerry Hough, PA-C   Stopped at 03/10/17 0151  . ziprasidone (GEODON) capsule 60 mg  60 mg Oral BID WC Micheal Likens, MD   60 mg at 03/16/17 0818   PTA  Medications: Medications Prior to Admission  Medication Sig Dispense Refill Last Dose  . b complex vitamins capsule Take 1 capsule by mouth daily.     . calcium carbonate (TUMS - DOSED IN MG ELEMENTAL CALCIUM) 500 MG chewable tablet Chew 1 tablet by mouth as needed for indigestion or heartburn.     . Melatonin 3 MG CAPS Take 3 mg by mouth at bedtime as needed (For sleep.).      Marland Kitchen acetaminophen (TYLENOL) 500 MG tablet Take 1,000 mg by mouth 2 (two) times daily as needed (pain).   03/04/2017 at Unknown time    Patient Stressors:    Patient Strengths:    Treatment Modalities: Medication Management, Group therapy, Case management,  1 to 1 session with clinician, Psychoeducation, Recreational therapy.   Physician Treatment Plan for Primary Diagnosis: Bipolar affective disorder, current episode manic with psychotic symptoms (HCC) Long Term Goal(s): Improvement in symptoms so as ready for discharge Improvement in symptoms so as ready for discharge   Short Term Goals: Ability to maintain clinical measurements within normal limits will improve Compliance with prescribed medications will improve  Medication Management: Evaluate patient's response, side effects, and tolerance of medication regimen.  Therapeutic Interventions: 1 to 1 sessions, Unit Group sessions and Medication administration.  Evaluation of Outcomes: Progressing   1/2: 2nd opinion required to force  medications on 12/28. Ongoing irritability and symptoms of mania. She agrees to increase dose of geodon and gabapentin today.   - Bipolar I current episode manic with psychotic features - Change geodon 40mg  po BID with meals to geodon 60mg  po BID with meals - Change gabapentin 100mg  BID to gabapentin 300mg  BID - Continue haldol 5mg  po/IM q8h prn agitation with ativan 1mg  po/IM q8h prn agitation    Physician Treatment Plan for Secondary Diagnosis: Principal Problem:   Bipolar  affective disorder, current episode manic with psychotic symptoms (HCC) Active Problems:   Psychosis (HCC)  Long Term Goal(s): Improvement in symptoms so as ready for discharge Improvement in symptoms so as ready for discharge   Short Term Goals: Ability to maintain clinical measurements within normal limits will improve Compliance with prescribed medications will improve     Medication Management: Evaluate patient's response, side effects, and tolerance of medication regimen.  Therapeutic Interventions: 1 to 1 sessions, Unit Group sessions and Medication administration.  Evaluation of Outcomes: Progressing   RN Treatment Plan for Primary Diagnosis: Bipolar affective disorder, current episode manic with psychotic symptoms (HCC) Long Term Goal(s): Knowledge of disease and therapeutic regimen to maintain health will improve  Short Term Goals: Ability to remain free from injury will improve, Ability to verbalize frustration and anger appropriately will improve, Ability to demonstrate self-control and Ability to participate in decision making will improve  Medication Management: RN will administer medications as ordered by provider, will assess and evaluate patient's response and provide education to patient for prescribed medication. RN will report any adverse and/or side effects to prescribing provider.  Therapeutic Interventions: 1 on 1 counseling sessions, Psychoeducation, Medication administration, Evaluate responses to treatment, Monitor vital signs and CBGs as ordered, Perform/monitor CIWA, COWS, AIMS and Fall Risk screenings as ordered, Perform wound care treatments as ordered.  Evaluation of Outcomes: Progressing   LCSW Treatment Plan for Primary Diagnosis: Bipolar affective disorder, current episode manic with psychotic symptoms (HCC) Long Term Goal(s): Safe transition to appropriate next level of care at discharge, Engage patient in therapeutic group addressing interpersonal  concerns.  Short Term Goals: Engage patient in aftercare planning with referrals and resources, Increase social support, Increase ability to appropriately verbalize feelings, Increase emotional regulation and Increase skills for wellness and recovery  Therapeutic Interventions: Assess for all discharge needs, 1 to 1 time with Social worker, Explore available resources and support systems, Assess for adequacy in community support network, Educate family and significant other(s) on suicide prevention, Complete Psychosocial Assessment, Interpersonal group therapy.  Evaluation of Outcomes: Progressing   Progress in Treatment: Attending groups: No Participating in groups: No. Taking medication as prescribed: No. Toleration medication: No. Family/Significant other contact made: No-No supports Patient understands diagnosis: No. and As evidenced by:  Limited insight  Discussing patient identified problems/goals with staff: No. Medical problems stabilized or resolved: Yes. Denies suicidal/homicidal ideation: Yes. Issues/concerns per patient self-inventory: No. Other: NA  New problem(s) identified: No, Describe:  NA  New Short Term/Long Term Goal(s):  Discharge Plan or Barriers: Pt plans to return home and follow up with outpatient.    Reason for Continuation of Hospitalization: Hallucinations Medication stabilization Mood instability  Estimated Length of Stay: 03/21/17   Attendees: Patient:  03/16/2017 8:20 AM  Physician: Dr. Altamese [place]  03/16/2017 8:20 AM  Nursing: Clydie BraunKaren, RN  03/16/2017 8:20 AM  RN Care Manager: Victorino DikeJennifer, RN  03/16/2017 8:20 AM  Social Worker: Ida Rogueodney B Jevaeh Shams, LCSW  03/16/2017 8:20 AM  Recreational Therapist:  03/16/2017 8:20 AM  Other:  03/16/2017 8:20 AM  Other:  03/16/2017 8:20 AM  Other: 03/16/2017 8:20 AM    Scribe for Treatment Team: Ida Rogue, LCSW 03/16/2017 8:20 AM

## 2017-03-17 MED ORDER — ZIPRASIDONE HCL 80 MG PO CAPS
80.0000 mg | ORAL_CAPSULE | Freq: Two times a day (BID) | ORAL | Status: DC
  Administered 2017-03-17 – 2017-03-23 (×12): 80 mg via ORAL
  Filled 2017-03-17 (×9): qty 1
  Filled 2017-03-17: qty 14
  Filled 2017-03-17: qty 1
  Filled 2017-03-17: qty 2
  Filled 2017-03-17 (×3): qty 1
  Filled 2017-03-17: qty 14
  Filled 2017-03-17: qty 1

## 2017-03-17 MED ORDER — LITHIUM CARBONATE ER 300 MG PO TBCR
300.0000 mg | EXTENDED_RELEASE_TABLET | Freq: Two times a day (BID) | ORAL | Status: DC
  Filled 2017-03-17 (×5): qty 1

## 2017-03-17 NOTE — Progress Notes (Addendum)
Patient approached nurse's station at 2230. Patient verbally aggressive and abusive to both MHTs and both RNs at the nurse's station. Patient inquires about shaving at this time. Patient informed at 2230 it is almost time for bed and is encouraged to shave in the morning. Patient aggressively asking staff how long they had worked at the facility to the month they started. Patient then asked writer, "what kind of nurse are you?" Patient became agitated and loudly yells, "this is not a psychiatric hall, this is a behavioral health hospital. This is the [facility] hall for fall risks, I am a fall risk". Patient states, "I need help in the shower. I need your help to wash me in the shower". Writer informed patient she could provide supplies but that the patient could wash herself. Patient became agitated and yells, "you don't want to help me because you don't THINK I need help? You do a lot of thinking don't you?" RN Herbert SetaHeather attempting to verbally deescalate patient. Patient continues to yell and berate staff. RN Herbert SetaHeather instructs patient to either go to her room or the dayroom. Patient refuses.  AC, charge nurse, security and NP [employer] CorporationJason notified. These individuals respond to the scene. [facility], "I want to shave tonight. I don't want to wait until the morning to shave". AC agrees to watch patient shave with MHT. Patient shaved without incident. Patient states to [facility], "I had a temper tantrum I guess. I'm sorry".  Patient now resting in room. Will continue to monitor.

## 2017-03-17 NOTE — Progress Notes (Signed)
Nursing Progress Note 1900-0730  D) Patient continues to present with irritable/agitated mood. Writer introduced self to patient and patient stated, "No you are not my nurse thank you very much. I have had all my meds tonight don't you offer me no medicine". Patient has aggressive, loud and rapid speech. Patient did attend group and requested snacks. Patient is up in the milieu but minimally engaged with staff/peers. Patient is observed responding to internal stimuli. Patient denies SI/HI or pain.   A) Emotional support given. 1:1 interaction and active listening provided. Snacks and fluids provided. Opportunities for questions or concerns presented to patient. Patient encouraged to continue to work on treatment goals. Labs, vital signs and patient behavior monitored throughout shift. Patient safety maintained with q15 min safety checks. Low fall risk precautions in place.  R) Patient refused scheduled medications this evening. Patient remains safe on the unit at this time. Will continue to monitor.
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
## 2017-03-17 NOTE — [facility] Group Notes (Signed)
[facility]  03/17/2017 , 2:04 PM    Type of Therapy:  [employer] Presentation  Participation Level:  Active  Participation Quality:  Attentive  Affect:  Blunted  Cognitive:  Oriented  Insight:  Limited  Engagement in Therapy:  Engaged  Modes of Intervention:  Discussion, Education and Socialization  Summary of Progress/Problems:  Tammi  from [employer] came to present her recovery story, encourage group  members to share something about their story, and present information about the [facility].  Invited.  Chose to not attend.  [name], Yves Fodor B 03/17/2017 , 2:04 PM

## 2017-03-17 NOTE — Progress Notes (Signed)
Recreation Therapy Notes  Date: 03/17/17 Time: 1000 Location: 500 Hall Dayroom  Group Topic: Stress Management  Goal Area(s) Addresses:  Patient will verbalize importance of using healthy stress management.  Patient will identify positive emotions associated with healthy stress management.   Behavioral Response: Engaged  Intervention: Stress Management  Activity :  LRT introduced stress management with the group.  LRT went over and 3 stress management techniques with the group.  LRT introduced guided imagery, progressive muscle relaxation and meditation to the group.  LRT presented the guided imagery and progressive muscle relaxation by reading scripts and allowing the patients to follow along and participate in each.  The meditation was presented from the Calm app.  Education:  Stress Management, Discharge Planning.   Education Outcome: Acknowledges edcuation/In group clarification offered/Needs additional education  Clinical Observations/Feedback: Pt stated when people get stressed "fight or flight kicks in".  Pt went on to explain it as "you either run or stand still".  Pt also stated the heart rate speeds up.  Pt was engaged in each technique and stated she felt relaxed after each.  Pt was appropriate for most of the group.  Towards the end of group, pt started talking to herself but not in a disruptive manner.  Pt also received a handout of more stress management techniques to use as needed.   Caroll RancherMarjette Jamarquis Crull, LRT/CTRS    Caroll RancherLindsay, Brytney Somes A 03/17/2017 12:20 PM

## 2017-03-17 NOTE — Progress Notes (Signed)
Patient is irritable, labile and pressured.  Patient became irritable and argumentative when physician talked to her about adding mood stabilizing  Medications to her medication profile.  Patient was using derogatory statements toward physician.  Patient also began to talk about being pregnant and not wanting to take any medications to harm her unborn child.  Patient even stated that she was aware that her pregnancy test was negative that she still believed that she was pregnant. Patient is unable to have any insight into how her threatening behaviors contribute to the measures taken to help patient maintain her own safety. Patient continues with threatening and aggressive behaviors.   Assess patient for safety, offer medications as prescribed, engage patient in 1:1 staff talks.   Patient remains irritable and labile.  Patient needs much redirection and staff support to maintain safety.  Continue to monitor as planned.

## 2017-03-18 MED ORDER — HALOPERIDOL 5 MG PO TABS
5.0000 mg | ORAL_TABLET | Freq: Two times a day (BID) | ORAL | Status: DC
  Filled 2017-03-18 (×10): qty 1

## 2017-03-18 NOTE — [facility] Group Notes (Signed)
[facility] LCSW Group Therapy  03/18/2017  1:05 PM  Type of Therapy:  Group therapy  Participation Level:  Active  Participation Quality:  Attentive  Affect:  Flat  Cognitive:  Oriented  Insight:  Limited  Engagement in Therapy:  Limited  Modes of Intervention:  Discussion, Socialization  Summary of Progress/Problems:  Chaplain was here to lead a group on themes of hope and courage.Invited.  Chose to not attend.  [name], [name] 03/18/2017 1:17 PM

## 2017-03-18 NOTE — Progress Notes (Signed)
Pt is loud, rude, inpatient, verbally abusive, demanding and angry.  Pt sts that this writer will administer her meds the way she wants them.  "Scan my bracelet now, look for the meds after.  I dont want that med, and if you don't do this the way I say, I don't want anything.  Pt advised that if she wants he meds she will have her bracelet scanned at the time of med administration.  Pt wants the Tums in the med cart changed to one from the store.  "That isn't right".  Pt refuses TUMS.  Pt is verbally abusive and calling this Clinical research associatewriter names and stating she want the tech to give her the meds.  Pt eventually complies with partial med administration after walking off several time.

## 2017-03-18 NOTE — Progress Notes (Signed)
Pt in hallway with no cloths on.  Pt redirected to her room by female staff.
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
## 2017-03-18 NOTE — Progress Notes (Signed)
Writer observed patient interact with peer's visitors. Patient appeared to know peer's visitors from prior to hospitalization. Patient was kind, polite and respectful towards visitors. Patient was appropriate during conversation.

## 2017-03-18 NOTE — Progress Notes (Signed)
Recreation Therapy Notes  Date: 03/18/17 Time: 1000 Location: 500 Hall Dayroom  Group Topic: Self-Esteem  Goal Area(s) Addresses:  Patient will successfully identify positive attributes about themselves.  Patient will successfully identify benefit of improved self-esteem.   Behavioral Response: Engaged  Intervention: Scientist, clinical (histocompatibility and immunogenetics)Construction paper, markers  Activity: Personalized Plates.  Patients were to create their own license plates that described unique or important things about them.  Education:  Self-Esteem, Building control surveyorDischarge Planning.   Education Outcome: Acknowledges education/In group clarification offered/Needs additional education  Clinical Observations/Feedback: Pt arrived late to group.  Pt highlighted on her plate that [place], has [place], likes Knicks basketball, has been with her husband from 05/2015 and her married name is Nurse, children'sChapelle.     Caroll RancherMarjette Amiri Tritch, LRT/CTRS      Caroll RancherLindsay, Mairin Lindsley A 03/18/2017 12:43 PM

## 2017-03-18 NOTE — Progress Notes (Addendum)
Patient is awake and requesting snacks from MHT. Patient states to MHT, "I will call you Miss Patience because I don't want to disrespect you". Patient refused ginger ale from Clinical research associatewriter and refuses to Printmakeracknowledge writer. Patient observed responding to internal stimuli in the hallway. MHT provides patient with snacks and ginger ale. Patient has returned to her room. Will continue to monitor.

## 2017-03-19 MED ORDER — DIPHENHYDRAMINE HCL 50 MG/ML IJ SOLN
INTRAMUSCULAR | Status: AC
  Filled 2017-03-19: qty 1

## 2017-03-19 MED ORDER — DIPHENHYDRAMINE HCL 50 MG/ML IJ SOLN: 25.0000 mg | Freq: Once | INTRAMUSCULAR | Status: DC

## 2017-03-19 MED ORDER — HALOPERIDOL LACTATE 5 MG/ML IJ SOLN
10.0000 mg | Freq: Once | INTRAMUSCULAR | Status: AC
  Administered 2017-03-19: 10 mg via INTRAMUSCULAR

## 2017-03-19 MED ORDER — DIPHENHYDRAMINE HCL 50 MG/ML IJ SOLN
50.0000 mg | Freq: Once | INTRAMUSCULAR | Status: AC
  Administered 2017-03-19: 50 mg via INTRAMUSCULAR

## 2017-03-19 MED ORDER — LORAZEPAM 2 MG/ML IJ SOLN
2.0000 mg | Freq: Once | INTRAMUSCULAR | Status: AC
  Administered 2017-03-19: 2 mg via INTRAMUSCULAR

## 2017-03-19 NOTE — Progress Notes (Signed)
Patient ID: Rebecca Lozano, female   DOB: 26-Oct-1980, 37 y.o.   MRN: [id number]    D: Pt has been very agitated, irritable, verbally aggressive, threatening, and demanding on the unit today. When other patients were getting medication she would stand behind them and tell them "to get the fuck out the way". When she took her own medication she refused haldol, and then told staff that she knows her body and that they "just needed to do what the fuck they were told." Pt then demanded a snack and cussed staff out when she did not get it. Pt attempted to go down to lunch while in line she became verbally aggressive, demanding and threatening towards all staff and other patients. Show of force was called, pt escorted back to unit where she was given Ativan, Benadryl, and Haldol. Pt continued to display same behavior for about 1 hour then she went to sleep. Pt reported that her depression was a 0, her hopelessness was a 0, her anxiety was a 0. Pt reported that her goal for today was to rest and relax.  Pt reported being negative SI/HI, no AH/VH noted. A: 15 min checks continued for patient safety. R: Pt safety maintained.

## 2017-03-19 NOTE — [facility] Group Notes (Signed)
[facility] Group Notes: (Clinical Social Work)   03/19/2017      Type of Therapy:  Group Therapy   Participation Level:  Did Not Attend despite MHT prompting   Dinah Lupa Grossman-Orr, LCSW 03/19/2017, 11:52 AM     

## 2017-03-19 NOTE — Progress Notes (Signed)
Pt up in hallway.  "Im hot, I want something cold", Happy New Year"  "Its a new day" .  Takes drink and walks back to room

## 2017-03-19 NOTE — Progress Notes (Signed)
[facility] MD Progress Note  03/19/2017 12:26 PM Rebecca Lozano   MRN:  [id number]   Subjective:  Rebecca Lozano is awake, alert and oriented. Seen standing on the unit yelling. " I don't take medications"  Patient presents with irritability and tangential thoughts. Patient is pressured and hyper verbal with her speech . Rebecca Lozano is demanding to speak to "[place] the Interior and spatial designer of nursing services". Patient is hyperreligous and observed using profanity, aggressive and threatening  toward staff and peers. Patient continues to repeat she is not delusional and reports taken Haldo as ordred. - RN reports patient continues to refuse PO medications. [facility])  Per nursing staff patient continues to need a lot of encouragement and constance redirection. Patient was provided with agitation medications at 12:30. Staff reports that patient agitation is not improving. Support, encouragement and reassurance was provided.     Principal Problem: Bipolar affective disorder, current episode manic with psychotic symptoms (HCC) Diagnosis:   Patient Active Problem List   Diagnosis Date Noted  . Bipolar affective disorder, current episode manic with psychotic symptoms (HCC) [F31.2] 03/10/2017  . Psychosis (HCC) [F29] 03/09/2017   Total Time spent with patient: 20 minutes  Past Psychiatric History:   Past Medical History:  Past Medical History:  Diagnosis Date  . Bipolar 1 disorder (HCC)   . CHF (congestive heart failure) (HCC)   . GERD (gastroesophageal reflux disease)   . Hypertension   . Seizures (HCC)     Past Surgical History:  Procedure Laterality Date  . NO PAST SURGERIES     Family History: History reviewed. No pertinent family history. Family Psychiatric  History:  Social History:  Social History   Substance and Sexual Activity  Alcohol Use No     Social History   Substance and Sexual Activity  Drug Use No    Social History   Socioeconomic History  . Marital status: Single    Spouse name: None   . Number of children: None  . Years of education: None  . Highest education level: None  Social Needs  . Financial resource strain: None  . Food insecurity - worry: None  . Food insecurity - inability: None  . Transportation needs - medical: None  . Transportation needs - non-medical: None  Occupational History  . None  Tobacco Use  . Smoking status: Current Every Day Smoker    Packs/day: 1.00    Types: Cigarettes, Cigars  . Smokeless tobacco: Never Used  Substance and Sexual Activity  . Alcohol use: No  . Drug use: No  . Sexual activity: Not Currently  Other Topics Concern  . None  Social History Narrative  . None   Additional Social History:                         Sleep: Fair  Appetite:  Good  Current Medications: Current Facility-Administered Medications  Medication Dose Route Frequency Provider Last Rate Last Dose  . diphenhydrAMINE (BENADRYL) 50 MG/ML injection           . acetaminophen (TYLENOL) tablet 650 mg  650 mg Oral Q6H PRN Micheal Likens, MD   650 mg at 03/19/17 0801  . calcium carbonate (TUMS - dosed in mg elemental calcium) chewable tablet 200 mg of elemental calcium  1 tablet Oral BID PRN Micheal Likens, MD   200 mg of elemental calcium at 03/19/17 0801  . gabapentin (NEURONTIN) capsule 300 mg  300 mg Oral BID Jolyne Loa  T, MD   300 mg at 03/19/17 0759  . guaiFENesin (MUCINEX) 12 hr tablet 600 mg  600 mg Oral BID Jolyne Loaainville, Christopher T, MD   600 mg at 03/19/17 0759  . haloperidol (HALDOL) tablet 5 mg  5 mg Oral Q8H PRN Micheal Likensainville, Christopher T, MD      . haloperidol (HALDOL) tablet 5 mg  5 mg Oral BID Micheal Likensainville, Christopher T, MD      . haloperidol lactate (HALDOL) injection 5 mg  5 mg Intramuscular Q8H PRN Micheal Likensainville, Christopher T, MD       And  . LORazepam (ATIVAN) injection 1 mg  1 mg Intramuscular Q8H PRN Micheal Likensainville, Christopher T, MD      . hydrOXYzine (ATARAX/VISTARIL) tablet 50 mg  50 mg Oral Q6H PRN  [name] ConnBerry, Jason A, NP   50 mg at 03/17/17 1811  . LORazepam (ATIVAN) tablet 1 mg  1 mg Oral Q6H PRN Kerry HoughSimon, Spencer E, PA-C      . multivitamin with minerals tablet 1 tablet  1 tablet Oral Daily Micheal Likensainville, Christopher T, MD   1 tablet at 03/19/17 0759  . traZODone (DESYREL) tablet 50 mg  50 mg Oral QHS,MR X 1 Kerry HoughSimon, Spencer E, PA-C   Stopped at 03/10/17 0151  . ziprasidone (GEODON) capsule 80 mg  80 mg Oral BID WC Micheal Likensainville, Christopher T, MD   80 mg at 03/19/17 0759    Lab Results: No results found for this or any previous visit (from the past 48 hour(s)).  Blood Alcohol level:  Lab Results  Component Value Date   ETH <10 03/05/2017   ETH (H) 08/19/2010    <11        LOWEST DETECTABLE LIMIT FOR SERUM ALCOHOL IS 5 mg/dL FOR MEDICAL PURPOSES ONLY    Metabolic Disorder Labs: No results found for: HGBA1C, MPG No results found for: PROLACTIN No results found for: CHOL, TRIG, HDL, CHOLHDL, VLDL, LDLCALC  Physical Findings: AIMS: Facial and Oral Movements Muscles of Facial Expression: None, normal Lips and Perioral Area: None, normal Jaw: None, normal Tongue: None, normal,Extremity Movements Upper (arms, wrists, hands, fingers): None, normal Lower (legs, knees, ankles, toes): None, normal, Trunk Movements Neck, shoulders, hips: None, normal, Overall Severity Severity of abnormal movements (highest score from questions above): None, normal Incapacitation due to abnormal movements: None, normal Patient's awareness of abnormal movements (rate only patient's report): No Awareness, Dental Status Current problems with teeth and/or dentures?: No Does patient usually wear dentures?: No  CIWA:  CIWA-Ar Total: 4 COWS:     Musculoskeletal: Strength & Muscle Tone: within normal limits Gait & Station: normal Patient leans: N/A  Psychiatric Specialty Exam: Physical Exam  Constitutional: She appears well-developed.  Neurological: She is alert.  Psychiatric: She has a normal mood and affect.     Review of Systems  Psychiatric/Behavioral: The patient is nervous/anxious.        Manic, disruptive behavior     Blood pressure (!) 138/93, pulse 93, temperature 98.2 F (36.8 C), temperature source Oral, resp. rate 16, height 5\' 8"  (1.727 m), weight 108.9 kg (240 lb).Body mass index is 36.49 kg/m.  General Appearance: Bizarre and Disheveled  Eye Contact:  Fair intense staring    Speech:  Pressured rapid   Volume:  Increased  Mood:  Dysphoric and Irritable  Affect:  Inappropriate and Labile  Thought Process:  Disorganized and Descriptions of Associations: Tangential  Orientation:  Full (Time, Place, and Person)  Thought Content:  Illogical, Paranoid Ideation, Rumination and Tangential  Suicidal Thoughts:  didnt respond to question  Homicidal Thoughts:  No  Memory:  Immediate;   Poor Recent;   Fair Remote;   Poor  Judgement:  Poor  Insight:  Lacking  Psychomotor Activity:  Restlessness  Concentration:  Concentration: Poor  Recall:  Poor  Fund of Knowledge:  Poor  Language:  Fair  Akathisia:  No  Handed:  Right  AIMS (if indicated):     Assets:  Communication Skills Desire for Improvement Intimacy Resilience Social Support  ADL's:  Intact  Cognition:  WNL  Sleep:  Number of Hours: 3     Treatment Plan Summary: Daily contact with patient to assess and evaluate symptoms and progress in treatment and Medication management    Continue with current treatment plan1/5/2019plan as below except where it is noted.  - Bipolar Icurrent episode manic with psychotic features -Continuegeodon 80 mg po BID with meals -Continuegabapentin 300mg  BID               - Continue Neurontin 300 mg for BID  - Continue haldol 5mg  BID agitation with ativan 1mg  po/IM q8h prn agitation  -Anxiety - Continue atarax 50mg  po q6h prn anxiety  - Insomnia - Continue trazodone 50mg  qhs prn  insomnia   -  Initiate Unit restriction  One time agitation protocol medication was order ie EKG. -see MAR for medications IM.  - Encourage participation in groups and therapeutic milieu   - Discharge planning will be ongoing   Oneta Rack, NP 03/19/2017, 12:26 PM

## 2017-03-20 NOTE — Progress Notes (Signed)
DAR Note Pt observed in room and actively hallucinating-Pt shouting at the top of her voice at someone that is not there. Pt will not answer any assessment questions; "don't ask me any question and no I don't want to kill myself." Pt does not look to be in any distress at this time. Support, encouragement, and safe environment provided. 15-minute safety checks continue. Pt was med compliant. Safety checks continue.

## 2017-03-20 NOTE — Progress Notes (Signed)
DAR NOTE: Patient presents with anxious affect and labile mood. Pt was very irritable and agitated during med pass this morning, pt refused her scheduled haldol instead requested for vistaril. Pt has been pacing between her room and dayroom talking loud to herself. Pt ate lunch and snacks without any problems. Denies pain, auditory and visual hallucinations when asked, report good sleep, good appetite high energy and good concentration.  Rates depression at 0, hopelessness at 0, and anxiety at 2.  Maintained on routine safety checks.  Medications given as prescribed.  Support and encouragement offered as needed. Did not attended the group.  States goal for today is "rest, communicate, vacate." Will continue to monitor.

## 2017-03-20 NOTE — [facility] Group Notes (Signed)
[facility] LCSW Group Therapy Note  Date/Time:  03/20/2017  11:00AM-12:00PM  Type of Therapy and Topic:  Group Therapy:  Music and Mood  Participation Level:  Did Not Attend   Description of Group: In this process group, members listened to a variety of genres of music and identified that different types of music evoke different responses.  Patients were encouraged to identify music that was soothing for them and music that was energizing for them.  Patients discussed how this knowledge can help with wellness and recovery in various ways including managing depression and anxiety as well as encouraging healthy sleep habits.    Therapeutic Goals: 1. Patients will explore the impact of different varieties of music on mood 2. Patients will verbalize the thoughts they have when listening to different types of music 3. Patients will identify music that is soothing to them as well as music that is energizing to them 4. Patients will discuss how to use this knowledge to assist in maintaining wellness and recovery 5. Patients will explore the use of music as a coping skill  Summary of Patient Progress:  N/A  Therapeutic Modalities: Solution Focused Brief Therapy Activity   Maleni Seyer Grossman-Orr, LCSW    

## 2017-03-20 NOTE — Progress Notes (Signed)
[facility]  03/20/2017 3:42 PM Rebecca Lozano  MRN:  [id number]  Subjective: Rebecca Lozano reports, "I'm okay, bye".  Objective: Rebecca Lozano is a 37 y/o F with history of bipolar disorder who was admitted on IVC from [facility] where she was brought in by police after observed walking in street with disorganized behaviors, irritability, agitation, and SI.In ED pt waslabile, irritable, disorganized, and refusing medications. She wasbe placed on PRN protocol for agitation, and shecontinued to refuse medications. Pt had ongoing agitation on the unit. Second opinion was provided by Dr. Jama Flavorsobos who agreed with plan for forced medications on 03/11/17, and pt was started on haldol 5mg  BID. Pt was then transitioned to previous home medication of geodon as she voiced that she would agree to take that medication. However, pt had ongoing episodes of agitation, and yesterday she required PRN of geodon 20mg  IM to control her agitation.    Today 03-20-17, at interview, pt remains guarded, irritable, verbally aggressive and vaguely paranoid. She is sitting on her bed facing the window. Says, I'm okay, bye".  Pt remains pressured, agitated, yelling/threatening/posturing. She remains uncooperative with staff, has a mind of her own. She comes out of her when she wants & will go back to her room when she feels like it. She does have a full conversation with her self in her room, talking/laughing loudly. She continues to require treatment as recommended.  Principal Problem: Bipolar affective disorder, current episode manic with psychotic symptoms (HCC)  Diagnosis:   Patient Active Problem List   Diagnosis Date Noted  . Bipolar affective disorder, current episode manic with psychotic symptoms (HCC) [F31.2] 03/10/2017  . Psychosis (HCC) [F29] 03/09/2017   Total Time spent with patient: 15 minutes  Past Psychiatric History: See H&P  Past Medical History:  Past Medical History:  Diagnosis Date  . Bipolar 1 disorder  (HCC)   . CHF (congestive heart failure) (HCC)   . GERD (gastroesophageal reflux disease)   . Hypertension   . Seizures (HCC)     Past Surgical History:  Procedure Laterality Date  . NO PAST SURGERIES     Family History: History reviewed. No pertinent family history.  Family Psychiatric  History: See H&P  Social History:  Social History   Substance and Sexual Activity  Alcohol Use No     Social History   Substance and Sexual Activity  Drug Use No    Social History   Socioeconomic History  . Marital status: Single    Spouse name: None  . Number of children: None  . Years of education: None  . Highest education level: None  Social Needs  . Financial resource strain: None  . Food insecurity - worry: None  . Food insecurity - inability: None  . Transportation needs - medical: None  . Transportation needs - non-medical: None  Occupational History  . None  Tobacco Use  . Smoking status: Current Every Day Smoker    Packs/day: 1.00    Types: Cigarettes, Cigars  . Smokeless tobacco: Never Used  Substance and Sexual Activity  . Alcohol use: No  . Drug use: No  . Sexual activity: Not Currently  Other Topics Concern  . None  Social History Narrative  . None   Additional Social History:   Sleep: Poor  Appetite:  Good  Current Medications: Current Facility-Administered Medications  Medication Dose Route Frequency Provider Last Rate Last Dose  . acetaminophen (TYLENOL) tablet 650 mg  650 mg Oral Q6H PRN Jolyne Loaainville, Christopher  T, MD   650 mg at 03/19/17 1834  . calcium carbonate (TUMS - dosed in mg elemental calcium) chewable tablet 200 mg of elemental calcium  1 tablet Oral BID PRN Micheal Likens, MD   200 mg of elemental calcium at 03/20/17 0800  . gabapentin (NEURONTIN) capsule 300 mg  300 mg Oral BID Micheal Likens, MD   300 mg at 03/20/17 0753  . guaiFENesin (MUCINEX) 12 hr tablet 600 mg  600 mg Oral BID Jolyne Loa T, MD   600 mg  at 03/20/17 1610  . haloperidol (HALDOL) tablet 5 mg  5 mg Oral Q8H PRN Micheal Likens, MD      . haloperidol (HALDOL) tablet 5 mg  5 mg Oral BID Micheal Likens, MD      . haloperidol lactate (HALDOL) injection 5 mg  5 mg Intramuscular Q8H PRN Micheal Likens, MD       And  . LORazepam (ATIVAN) injection 1 mg  1 mg Intramuscular Q8H PRN Micheal Likens, MD      . hydrOXYzine (ATARAX/VISTARIL) tablet 50 mg  50 mg Oral Q6H PRN [name] Conn A, NP   50 mg at 03/20/17 0800  . LORazepam (ATIVAN) tablet 1 mg  1 mg Oral Q6H PRN Kerry Hough, PA-C      . multivitamin with minerals tablet 1 tablet  1 tablet Oral Daily Micheal Likens, MD   1 tablet at 03/20/17 0752  . traZODone (DESYREL) tablet 50 mg  50 mg Oral QHS,MR X 1 Kerry Hough, PA-C   Stopped at 03/10/17 0151  . ziprasidone (GEODON) capsule 80 mg  80 mg Oral BID WC Micheal Likens, MD   80 mg at 03/20/17 9604   Lab Results: No results found for this or any previous visit (from the past 48 hour(s)).  Blood Alcohol level:  Lab Results  Component Value Date   ETH <10 03/05/2017   ETH (H) 08/19/2010    <11        LOWEST DETECTABLE LIMIT FOR SERUM ALCOHOL IS 5 mg/dL FOR MEDICAL PURPOSES ONLY   Metabolic Disorder Labs: No results found for: HGBA1C, MPG No results found for: PROLACTIN No results found for: CHOL, TRIG, HDL, CHOLHDL, VLDL, LDLCALC  Physical Findings: AIMS: Facial and Oral Movements Muscles of Facial Expression: None, normal Lips and Perioral Area: None, normal Jaw: None, normal Tongue: None, normal,Extremity Movements Upper (arms, wrists, hands, fingers): None, normal Lower (legs, knees, ankles, toes): None, normal, Trunk Movements Neck, shoulders, hips: None, normal, Overall Severity Severity of abnormal movements (highest score from questions above): None, normal Incapacitation due to abnormal movements: None, normal Patient's awareness of abnormal  movements (rate only patient's report): No Awareness, Dental Status Current problems with teeth and/or dentures?: No Does patient usually wear dentures?: No  CIWA:  CIWA-Ar Total: 4 COWS:     Musculoskeletal: Strength & Muscle Tone: within normal limits Gait & Station: normal Patient leans: N/A  Psychiatric Specialty Exam: Physical Exam  Nursing note and vitals reviewed.   Review of Systems  Constitutional: Negative for chills and fever.  Respiratory: Negative for cough and shortness of breath.   Cardiovascular: Negative for chest pain.  Psychiatric/Behavioral: Negative for depression and suicidal ideas.    Blood pressure (!) 147/91, pulse 93, temperature 98.6 F (37 C), temperature source Oral, resp. rate 16, height 5\' 8"  (1.727 m), weight 108.9 kg (240 lb).Body mass index is 36.49 kg/m.  General Appearance: Casual and Disheveled  Eye Contact:  Good  Speech:  Clear and Coherent and Pressured  Volume:  Increased  Mood:  Angry and Irritable  Affect:  Blunt, Congruent and Labile  Thought Process:  Goal Directed and Descriptions of Associations: Loose  Orientation:  Full (Time, Place, and Person)  Thought Content:  Illogical, Ideas of Reference:   Paranoia Delusions, Paranoid Ideation and Rumination  Suicidal Thoughts:  No  Homicidal Thoughts:  No  Memory:  Immediate;   Fair Recent;   Fair Remote;   Fair  Judgement:  Poor  Insight:  Lacking  Psychomotor Activity:  Increased  Concentration:  Concentration: Poor  Recall:  Poor  Fund of Knowledge:  Fair  Language:  Fair  Akathisia:  No  Handed:    AIMS (if indicated):     Assets:  Manufacturing systems engineer Physical Health Resilience Social Support  ADL's:  Intact  Cognition:  WNL  Sleep:  Number of Hours: 4.5   Treatment Plan Summary: Daily contact with patient to assess and evaluate symptoms and progress in treatment and Medication management. Pt has ongoing agitation, yelling, decreased need for sleep, irritability,  mood lability, paranoia, grandiosity, aggression and difficulty with redirection. She agreed to increase dose of Geodon previously. Currently on 2 antipsychotic therapies.  -Continue inpatient hospitalization (on IVC).  Will continue today 03/20/2017 plan as below except where it is noted.  - Bipolar I current episode manic with psychotic features -Continue Geodon 80mg  po BID with meals   - Continue Haldol 5 mg po BID - Continue gabapentin 300mg  BID - Continue haldol 5mg  po/IM q8h prn agitation with ativan 1mg  po/IM q8h prn agitation  -Anxiety - Continue atarax 50mg  po q6h prn anxiety  - Insomnia - Continue trazodone 50mg  qhs prn insomnia   - Encourage participation in groups and therapeutic milieu   - Discharge planning will be ongoing  Armandina Stammer, NP, PMHNP, FNP-BC 03/20/2017, 3:42 PMPatient ID: Tanija D Barthel, female   DOB: 1980/04/15, 37 y.o.   MRN: [id number]

## 2017-03-20 NOTE — Progress Notes (Signed)
Pt refused her 1700 haldol stating " the social worker knows I don't take haldol." Pt became so irritable when she requested for tums PRN but she could not have it because it was too soon to give.

## 2017-03-21 MED ORDER — GABAPENTIN 300 MG PO CAPS
600.0000 mg | ORAL_CAPSULE | Freq: Two times a day (BID) | ORAL | Status: DC
  Administered 2017-03-21 – 2017-03-23 (×4): 600 mg via ORAL
  Filled 2017-03-21: qty 28
  Filled 2017-03-21 (×6): qty 2
  Filled 2017-03-21: qty 28

## 2017-03-21 MED ORDER — HYDROXYZINE HCL 50 MG PO TABS
50.0000 mg | ORAL_TABLET | Freq: Two times a day (BID) | ORAL | Status: DC
Start: 2017-03-21 — End: 2017-03-23
  Administered 2017-03-21 – 2017-03-23 (×4): 50 mg via ORAL
  Filled 2017-03-21: qty 14
  Filled 2017-03-21 (×5): qty 1
  Filled 2017-03-21: qty 14
  Filled 2017-03-21: qty 1

## 2017-03-21 MED ORDER — BENZTROPINE MESYLATE 1 MG PO TABS
1.0000 mg | ORAL_TABLET | Freq: Two times a day (BID) | ORAL | Status: DC
  Administered 2017-03-21 – 2017-03-23 (×4): 1 mg via ORAL
  Filled 2017-03-21 (×3): qty 1
  Filled 2017-03-21 (×2): qty 14
  Filled 2017-03-21 (×3): qty 1

## 2017-03-21 NOTE — Progress Notes (Signed)
Patient ID: Rebecca Lozano, female   DOB: 1980/12/07, 37 y.o.   MRN: [id number]  DAR Note: Pt continues to be very loud, confused and delusional. Pt at the time of assessment was actively hallucinating-was having loud and intense conversation with self. Pt refused to participate, refused 2200 medications. Pt does not look to be in any distress at this time. Support, encouragement, and safe environment provided. 15-minute safety checks continue. Safety checks continue.

## 2017-03-21 NOTE — Progress Notes (Signed)
[facility] MD Progress Note  03/21/2017 3:08 PM Rebecca Lozano  MRN:  [id number]   Objective:Rebecca Lozano is awake, alert and oriented. Seen sitting in bed eating lunch. Patient present with irritably and slight aggression during this assessment. Patient was evaluated by MD and NP. Patient continues to deny suicidal or homicidal ideation ideations. Reports she is doing well and has requested to stop taken Haldol, as she feels the Geodon is helping with her mood and symptoms.  Denies auditory or visual hallucination and does not appear to be responding to internal stimuli during this assessment, however noted that patient was heard and observed preoccupied with thoughts and self. Myrical reports she is medication compliant to geodon and cogentin only and will continue to refuse haldol. Reports " I don't want to take two antipsychotics". [name] denies any medication side effects.  Reports good appetite as noted by staff, Patient continues to request double portions during lunch and dinner. Support, encouragement and reassurance was provided.   Principal Problem: Bipolar affective disorder, current episode manic with psychotic symptoms (HCC)  Diagnosis:   Patient Active Problem List   Diagnosis Date Noted  . Bipolar affective disorder, current episode manic with psychotic symptoms (HCC) [F31.2] 03/10/2017  . Psychosis (HCC) [F29] 03/09/2017   Total Time spent with patient: 15 minutes  Past Psychiatric History: See H&P  Past Medical History:  Past Medical History:  Diagnosis Date  . Bipolar 1 disorder (HCC)   . CHF (congestive heart failure) (HCC)   . GERD (gastroesophageal reflux disease)   . Hypertension   . Seizures (HCC)     Past Surgical History:  Procedure Laterality Date  . NO PAST SURGERIES     Family History: History reviewed. No pertinent family history.  Family Psychiatric  History: See H&P  Social History:  Social History   Substance and Sexual Activity  Alcohol Use No     Social  History   Substance and Sexual Activity  Drug Use No    Social History   Socioeconomic History  . Marital status: Single    Spouse name: None  . Number of children: None  . Years of education: None  . Highest education level: None  Social Needs  . Financial resource strain: None  . Food insecurity - worry: None  . Food insecurity - inability: None  . Transportation needs - medical: None  . Transportation needs - non-medical: None  Occupational History  . None  Tobacco Use  . Smoking status: Current Every Day Smoker    Packs/day: 1.00    Types: Cigarettes, Cigars  . Smokeless tobacco: Never Used  Substance and Sexual Activity  . Alcohol use: No  . Drug use: No  . Sexual activity: Not Currently  Other Topics Concern  . None  Social History Narrative  . None   Additional Social History:   Sleep: Poor  Appetite:  Good  Current Medications: Current Facility-Administered Medications  Medication Dose Route Frequency Provider Last Rate Last Dose  . acetaminophen (TYLENOL) tablet 650 mg  650 mg Oral Q6H PRN Micheal Likens, MD   650 mg at 03/19/17 1834  . benztropine (COGENTIN) tablet 1 mg  1 mg Oral BID Micheal Likens, MD      . calcium carbonate (TUMS - dosed in mg elemental calcium) chewable tablet 200 mg of elemental calcium  1 tablet Oral BID PRN Micheal Likens, MD   200 mg of elemental calcium at 03/21/17 0831  . gabapentin (NEURONTIN) capsule  600 mg  600 mg Oral BID Jolyne Loaainville, Christopher T, MD      . guaiFENesin (MUCINEX) 12 hr tablet 600 mg  600 mg Oral BID Jolyne Loaainville, Christopher T, MD   600 mg at 03/21/17 0831  . haloperidol (HALDOL) tablet 5 mg  5 mg Oral Q8H PRN Micheal Likensainville, Christopher T, MD      . haloperidol lactate (HALDOL) injection 5 mg  5 mg Intramuscular Q8H PRN Micheal Likensainville, Christopher T, MD   5 mg at 03/21/17 0951   And  . LORazepam (ATIVAN) injection 1 mg  1 mg Intramuscular Q8H PRN Micheal Likensainville, Christopher T, MD   1 mg at  03/21/17 0950  . hydrOXYzine (ATARAX/VISTARIL) tablet 50 mg  50 mg Oral BID Micheal Likensainville, Christopher T, MD      . LORazepam (ATIVAN) tablet 1 mg  1 mg Oral Q6H PRN Kerry HoughSimon, Spencer E, PA-C      . multivitamin with minerals tablet 1 tablet  1 tablet Oral Daily Micheal Likensainville, Christopher T, MD   1 tablet at 03/21/17 0831  . traZODone (DESYREL) tablet 50 mg  50 mg Oral QHS,MR X 1 Kerry HoughSimon, Spencer E, PA-C   Stopped at 03/10/17 0151  . ziprasidone (GEODON) capsule 80 mg  80 mg Oral BID WC Micheal Likensainville, Christopher T, MD   80 mg at 03/21/17 0831   Lab Results: No results found for this or any previous visit (from the past 48 hour(s)).  Blood Alcohol level:  Lab Results  Component Value Date   ETH <10 03/05/2017   ETH (H) 08/19/2010    <11        LOWEST DETECTABLE LIMIT FOR SERUM ALCOHOL IS 5 mg/dL FOR MEDICAL PURPOSES ONLY   Metabolic Disorder Labs: No results found for: HGBA1C, MPG No results found for: PROLACTIN No results found for: CHOL, TRIG, HDL, CHOLHDL, VLDL, LDLCALC  Physical Findings: AIMS: Facial and Oral Movements Muscles of Facial Expression: None, normal Lips and Perioral Area: None, normal Jaw: None, normal Tongue: None, normal,Extremity Movements Upper (arms, wrists, hands, fingers): None, normal Lower (legs, knees, ankles, toes): None, normal, Trunk Movements Neck, shoulders, hips: None, normal, Overall Severity Severity of abnormal movements (highest score from questions above): None, normal Incapacitation due to abnormal movements: None, normal Patient's awareness of abnormal movements (rate only patient's report): No Awareness, Dental Status Current problems with teeth and/or dentures?: No Does patient usually wear dentures?: No  CIWA:  CIWA-Ar Total: 4 COWS:     Musculoskeletal: Strength & Muscle Tone: within normal limits Gait & Station: normal Patient leans: N/A  Psychiatric Specialty Exam: Physical Exam  Nursing note and vitals reviewed. Psychiatric: She has a  normal mood and affect. Her behavior is normal.    Review of Systems  Constitutional: Negative for chills and fever.  Respiratory: Negative for cough and shortness of breath.   Cardiovascular: Negative for chest pain.  Psychiatric/Behavioral: Negative for depression and suicidal ideas.    Blood pressure 137/88, pulse 93, temperature 98.3 F (36.8 C), temperature source Oral, resp. rate 20, height 5\' 8"  (1.727 m), weight 108.9 kg (240 lb).Body mass index is 36.49 kg/m.  General Appearance: Disheveled  Eye Contact:  Good  Speech:  Clear and Coherent and Pressured  Volume:  Increased  Mood:  Angry and Irritable  Affect:  Blunt, Congruent and Labile  Thought Process:  Goal Directed and Descriptions of Associations: Loose  Orientation:  Full (Time, Place, and Person)  Thought Content:  Illogical, Ideas of Reference:   Paranoia Delusions, Paranoid Ideation and  Rumination  Suicidal Thoughts:  No  Homicidal Thoughts:  No  Memory:  Immediate;   Fair Recent;   Fair Remote;   Fair  Judgement:  Poor  Insight:  Lacking  Psychomotor Activity:  Increased  Concentration:  Concentration: Poor  Recall:  Poor  Fund of Knowledge:  Fair  Language:  Fair  Akathisia:  No  Handed:    AIMS (if indicated):     Assets:  Manufacturing systems engineer Physical Health Resilience Social Support  ADL's:  Intact  Cognition:  WNL  Sleep:  Number of Hours: 2.25   Treatment Plan Summary: Daily contact with patient to assess and evaluate symptoms and progress in treatment and Medication management.   Will continue today 03/21/2017 plan as below except where it is noted.  - Bipolar I current episode manic with psychotic features -Continue Geodon 80mg  po BID with meals   - Discontinue Haldol 5 mg po BID - Continue gabapentin 300mg  BID - Continue haldol 5mg  po/IM q8h prn agitation with ativan 1mg  po/IM q8h prn agitation  -Anxiety - Continue  atarax 50mg  po q6h prn anxiety  - Insomnia - Continue trazodone 50mg  qhs prn insomnia   - Encourage participation in groups and therapeutic milieu   - Discharge planning will be ongoing  Oneta Rack, NP 03/21/2017, 3:08 PM

## 2017-03-21 NOTE — Progress Notes (Signed)
Pt continues to be very loud, being verbally aggressive towards staff threatening  to punch their faces, calling staff names. Staff tried to redirect patient, but pt just escalated. PRN PO offered but pt refused and kept on being disruptive in the milieu. The show of force called, and pt was given 5 mg haldol IM and 1 mg Ativan IM. Will continue to monitor.

## 2017-03-21 NOTE — Progress Notes (Signed)
D: Pt sleping room majority of the evening, pt came out of her room crying , stating she did not want to get any shots , they make her blind. Pt was encouraged to take pills when offered and even if she gets agitated to take the pills when offered.  A: Pt was offered support and encouragement.  Pt was encourage to attend groups. Q 15 minute checks were done for safety.  R: safety maintained on unit.

## 2017-03-21 NOTE — Progress Notes (Signed)
Recreation Therapy Notes  Date: 03/21/17 Time: 1000 Location: 500 Hall Dayroom  Group Topic: Communication  Goal Area(s) Addresses:  Patient will effectively communicate with peers in group.  Patient will verbalize benefit of healthy communication. Patient will verbalize positive effect of healthy communication on post d/c goals.  Patient will identify communication techniques that made activity effective for group.   Behavioral Response: Engaged  Intervention: [employer] pictures, pencils, blank paper  Activity: Back to Back Drawings.  Each patient was given an opportunity to describe a picture to their peers.  The peers listening were to draw the picture as it was being described by the person talking.  Patients were unable to ask any clarifying questions of the person speaking.  Education: Communication, Discharge Planning  Education Outcome: Acknowledges understanding/In group clarification offered/Needs additional education.   Clinical Observations/Feedback: Pt stated in order to have good communication with someone, you have to "develop a relationship with them to know what their body language means".  Pt also stated the body language they give off may not match the words they are speaking.  As the speaker, pt reassured and encouraged her peers.  As the listener for one peer, patient stated she had to use "patience".  With the second peer, pt stated descriptions were "too short and to fast".  Pt reiterated the point of developing relationships with the people in your life to create better communication.    Rebecca RancherMarjette Rumaysa Lozano, LRT/CTRS     Rebecca RancherLindsay, [name] 03/21/2017 12:16 PM

## 2017-03-21 NOTE — Tx Team (Signed)
Interdisciplinary Treatment and Diagnostic Plan Update  03/21/2017 Time of Session: 9:00am  Rebecca Lozano MRN: [id number]  Principal Diagnosis: Bipolar affective disorder, current episode manic with psychotic symptoms (HCC)  Secondary Diagnoses: Principal Problem:   Bipolar affective disorder, current episode manic with psychotic symptoms (HCC) Active Problems:   Psychosis (HCC)   Current Medications:  Current Facility-Administered Medications  Medication Dose Route Frequency Provider Last Rate Last Dose  . acetaminophen (TYLENOL) tablet 650 mg  650 mg Oral Q6H PRN Micheal Likens, MD   650 mg at 03/19/17 1834  . calcium carbonate (TUMS - dosed in mg elemental calcium) chewable tablet 200 mg of elemental calcium  1 tablet Oral BID PRN Micheal Likens, MD   200 mg of elemental calcium at 03/21/17 0831  . gabapentin (NEURONTIN) capsule 300 mg  300 mg Oral BID Micheal Likens, MD   300 mg at 03/21/17 0831  . guaiFENesin (MUCINEX) 12 hr tablet 600 mg  600 mg Oral BID Jolyne Loa T, MD   600 mg at 03/21/17 0831  . haloperidol (HALDOL) tablet 5 mg  5 mg Oral Q8H PRN Micheal Likens, MD      . haloperidol (HALDOL) tablet 5 mg  5 mg Oral BID Micheal Likens, MD      . haloperidol lactate (HALDOL) injection 5 mg  5 mg Intramuscular Q8H PRN Micheal Likens, MD       And  . LORazepam (ATIVAN) injection 1 mg  1 mg Intramuscular Q8H PRN Micheal Likens, MD      . hydrOXYzine (ATARAX/VISTARIL) tablet 50 mg  50 mg Oral Q6H PRN [name] Conn A, NP   50 mg at 03/20/17 0800  . LORazepam (ATIVAN) tablet 1 mg  1 mg Oral Q6H PRN Kerry Hough, PA-C      . multivitamin with minerals tablet 1 tablet  1 tablet Oral Daily Micheal Likens, MD   1 tablet at 03/21/17 0831  . traZODone (DESYREL) tablet 50 mg  50 mg Oral QHS,MR X 1 Kerry Hough, PA-C   Stopped at 03/10/17 0151  . ziprasidone (GEODON) capsule 80 mg  80 mg Oral BID  WC Micheal Likens, MD   80 mg at 03/21/17 0831   PTA Medications: Medications Prior to Admission  Medication Sig Dispense Refill Last Dose  . b complex vitamins capsule Take 1 capsule by mouth daily.     . calcium carbonate (TUMS - DOSED IN MG ELEMENTAL CALCIUM) 500 MG chewable tablet Chew 1 tablet by mouth as needed for indigestion or heartburn.     . Melatonin 3 MG CAPS Take 3 mg by mouth at bedtime as needed (For sleep.).      Marland Kitchen acetaminophen (TYLENOL) 500 MG tablet Take 1,000 mg by mouth 2 (two) times daily as needed (pain).   03/04/2017 at Unknown time    Patient Stressors:    Patient Strengths:    Treatment Modalities: Medication Management, Group therapy, Case management,  1 to 1 session with clinician, Psychoeducation, Recreational therapy.   Physician Treatment Plan for Primary Diagnosis: Bipolar affective disorder, current episode manic with psychotic symptoms (HCC) Long Term Goal(s): Improvement in symptoms so as ready for discharge Improvement in symptoms so as ready for discharge   Short Term Goals: Ability to maintain clinical measurements within normal limits will improve Compliance with prescribed medications will improve  Medication Management: Evaluate patient's response, side effects, and tolerance of medication regimen.  Therapeutic Interventions: 1 to 1  sessions, Unit Group sessions and Medication administration.  Evaluation of Outcomes: Progressing   1/2: 2nd opinion required to force medications on 12/28. Ongoing irritability and symptoms of mania. She agrees to increase dose of geodon and gabapentin today.   - Bipolar I current episode manic with psychotic features - Change geodon 40mg  po BID with meals to geodon 60mg  po BID with meals - Change gabapentin 100mg  BID to gabapentin 300mg  BID - Continue haldol 5mg  po/IM q8h prn agitation with ativan 1mg  po/IM q8h prn agitation  1/7: -Continue Geodon  80mg  po BID with meals             - Discontinue Haldol 5 mg po BID -Continue gabapentin 300mg  BID - Continue haldol 5mg  po/IM q8h prn agitation with ativan 1mg  po/IM q8h prn agitation  -Anxiety - Continue atarax 50mg  po q6h prn anxiety  - Insomnia - Continue trazodone 50mg  qhs prn insomnia      Physician Treatment Plan for Secondary Diagnosis: Principal Problem:   Bipolar affective disorder, current episode manic with psychotic symptoms (HCC) Active Problems:   Psychosis (HCC)  Long Term Goal(s): Improvement in symptoms so as ready for discharge Improvement in symptoms so as ready for discharge   Short Term Goals: Ability to maintain clinical measurements within normal limits will improve Compliance with prescribed medications will improve     Medication Management: Evaluate patient's response, side effects, and tolerance of medication regimen.  Therapeutic Interventions: 1 to 1 sessions, Unit Group sessions and Medication administration.  Evaluation of Outcomes: Progressing   RN Treatment Plan for Primary Diagnosis: Bipolar affective disorder, current episode manic with psychotic symptoms (HCC) Long Term Goal(s): Knowledge of disease and therapeutic regimen to maintain health will improve  Short Term Goals: Ability to remain free from injury will improve, Ability to verbalize frustration and anger appropriately will improve, Ability to demonstrate self-control and Ability to participate in decision making will improve  Medication Management: RN will administer medications as ordered by provider, will assess and evaluate patient's response and provide education to patient for prescribed medication. RN will report any adverse and/or side effects to prescribing provider.  Therapeutic Interventions: 1 on 1 counseling sessions, Psychoeducation, Medication administration, Evaluate responses to  treatment, Monitor vital signs and CBGs as ordered, Perform/monitor CIWA, COWS, AIMS and Fall Risk screenings as ordered, Perform wound care treatments as ordered.  Evaluation of Outcomes: Progressing   LCSW Treatment Plan for Primary Diagnosis: Bipolar affective disorder, current episode manic with psychotic symptoms (HCC) Long Term Goal(s): Safe transition to appropriate next level of care at discharge, Engage patient in therapeutic group addressing interpersonal concerns.  Short Term Goals: Engage patient in aftercare planning with referrals and resources, Increase social support, Increase ability to appropriately verbalize feelings, Increase emotional regulation and Increase skills for wellness and recovery  Therapeutic Interventions: Assess for all discharge needs, 1 to 1 time with Social worker, Explore available resources and support systems, Assess for adequacy in community support network, Educate family and significant other(s) on suicide prevention, Complete Psychosocial Assessment, Interpersonal group therapy.  Evaluation of Outcomes: Progressing   Progress in Treatment: Attending groups: No Participating in groups: No. Taking medication as prescribed: No. Toleration medication: No. Family/Significant other contact made: No-No supports Patient understands diagnosis: No. and As evidenced by:  Limited insight  Discussing patient identified problems/goals with staff: No. Medical problems stabilized or resolved: Yes. Denies suicidal/homicidal ideation: Yes. Issues/concerns per patient self-inventory: No. Other: NA  New problem(s) identified: No, Describe:  NA  New Short  Term/Long Term Goal(s):  Discharge Plan or Barriers: [place] [facility] [place] [facility] [place] [facility] [place] continue to fight to get my disability check back"  CSW to follow up  Reason for Continuation of Hospitalization: Hallucinations Medication stabilization Mood instability  Estimated Length  of Stay: 03/25/17   Attendees: Patient:  03/21/2017 8:54 AM  Physician: Dr. Altamese Carolinaainville  03/21/2017 8:54 AM  Nursing: Roddie McElizabeth Awofadeju, RN  03/21/2017 8:54 AM  RN Care Manager: Victorino DikeJennifer, RN  03/21/2017 8:54 AM  Social Worker: Ida Rogueodney B Davanee Klinkner, LCSW  03/21/2017 8:54 AM  Recreational Therapist:  03/21/2017 8:54 AM  Other:  03/21/2017 8:54 AM  Other:  03/21/2017 8:54 AM  Other: 03/21/2017 8:54 AM    Scribe for Treatment Team: Ida Rogueodney B Selma Rodelo, LCSW 03/21/2017 8:54 AM

## 2017-03-21 NOTE — [facility] Group Notes (Signed)
LCSW Group Therapy Note  03/21/2017 1:15pm  Type of Therapy/Topic:  Group Therapy:  Balance in Life  Participation Level:  Did Not Attend  Description of Group:    This group will address the concept of balance and how it feels and looks when one is unbalanced. Patients will be encouraged to process areas in their lives that are out of balance and identify reasons for remaining unbalanced. Facilitators will guide patients in utilizing problem-solving interventions to address and correct the stressor making their life unbalanced. Understanding and applying boundaries will be explored and addressed for obtaining and maintaining a balanced life. Patients will be encouraged to explore ways to assertively make their unbalanced needs known to significant others in their lives, using other group members and facilitator for support and feedback.  Therapeutic Goals: 1. Patient will identify two or more emotions or situations they have that consume much of in their lives. 2. Patient will identify signs/triggers that life has become out of balance:  3. Patient will identify two ways to set boundaries in order to achieve balance in their lives:  4. Patient will demonstrate ability to communicate their needs through discussion and/or role plays  Summary of Patient Progress:      Therapeutic Modalities:   Cognitive Behavioral Therapy Solution-Focused Therapy Assertiveness Training  Rebecca Lozano, [place] 03/21/2017 4:27 PM

## 2017-03-21 NOTE — Progress Notes (Signed)
DAR NOTE: Patient presents with anxious affect and labile mood. Pt was very irritable and agitated this morning she did get two trays during breakfast, pt refused her scheduled haldol. Pt has been pacing between her room and dayroom talking loud to herself. Denies pain, auditory and visual hallucinations when asked, but has been observed talking to herself. Report good sleep, good appetite high energy and good concentration.  Rates depression at 0, hopelessness at 0, and anxiety at 0.  Maintained on routine safety checks.  Medications given as prescribed.  Support and encouragement offered as needed. Did not attended the group.  States goal for today is "discharge to [place] [phone]." Will continue to monitor.

## 2017-03-22 NOTE — Progress Notes (Signed)
Patient denies SI, HI and AVH. Patient has been less irritable and impulsive this shift.  Patient Denies SI, HI and AVH.  Patient was noted to have decreased pacing and was not noted talking to herself this shift.   Assess patient for safety offer medications as prescribed engage patient in 1:1 staff talks.   Patient able to contract for safety, continue to monitor as planned.

## 2017-03-22 NOTE — Progress Notes (Signed)
D: Pt denies SI/HI/AVH. Pt is pleasant and cooperative. Pt said ready for D/C tomorrow, pt was talking about [place] because her prescriptions are only for one month. Pt has been polite and visible on the milieu this evening  A: Pt was offered support and encouragement. Pt was given scheduled medications. Pt was encourage to attend groups. Q 15 minute checks were done for safety.   R:Pt attends groups and interacts well with peers and staff. Pt is taking medication. Pt has no complaints.Pt receptive to treatment and safety maintained on unit.

## 2017-03-22 NOTE — [facility] Group Notes (Signed)
Pt attended group. When ask how her day was pt stated is was a 10 perfect. Writer went over the rules of the unit.

## 2017-03-22 NOTE — Progress Notes (Signed)
[facility] MD Progress Note  03/22/2017 1:46 PM Rebecca Lozano  MRN:  [id number] Subjective:    Rebecca Lozano is a 37 y/o F with history of bipolar disorder who was admitted on IVC from [facility] where she was brought in by police after observed walking in street with disorganized behaviors, irritability, agitation, and SI.In ED pt waslabile, irritable, disorganized, and refusing medications. She wasbe placed on PRN protocol for agitation, and shecontinued to refuse medications. Pt had ongoing agitation on the unit. Second opinion obtained for forced medications on 03/11/17, and pt was transitioned to previous home medication of geodon which was titrated to dose of 80mg  BID with meals.  Pt had improvement of agitation, but she continues to have some irritability.   Todayat interview,pt reports that overall she is "good" today. She denies SI/HI/AH/VH. She is sleeping well and her appetite is good. She is tolerating her current medication regimen well, and she shares that she thinks her medications are helpful. RN staff shares that patient has been polite and cooperative on the unit, and she is adhering to all expectations and redirections by staff when she is having increasing irritability. Pt shares that her plan is leave the [place] area and return to [place] as soon as possible, and pt shares that she has already been discussing this option with the SW team in hopes of obtaining a bus ticket to [place]. Pt agrees to remain in the hospital until SW team secures a safe discharge plan, and she is in agreement to continue her current treatment regimen without changes. She had no further questions, comments, or concerns today.   Principal Problem: Bipolar affective disorder, current episode manic with psychotic symptoms (HCC) Diagnosis:   Patient Active Problem List   Diagnosis Date Noted  . Bipolar affective disorder, current episode manic with psychotic symptoms (HCC) [F31.2] 03/10/2017  . Psychosis (HCC)  [F29] 03/09/2017   Total Time spent with patient: 30 minutes  Past Psychiatric History: see H&P  Past Medical History:  Past Medical History:  Diagnosis Date  . Bipolar 1 disorder (HCC)   . CHF (congestive heart failure) (HCC)   . GERD (gastroesophageal reflux disease)   . Hypertension   . Seizures (HCC)     Past Surgical History:  Procedure Laterality Date  . NO PAST SURGERIES     Family History: History reviewed. No pertinent family history. Family Psychiatric  History: see H&P Social History:  Social History   Substance and Sexual Activity  Alcohol Use No     Social History   Substance and Sexual Activity  Drug Use No    Social History   Socioeconomic History  . Marital status: Single    Spouse name: None  . Number of children: None  . Years of education: None  . Highest education level: None  Social Needs  . Financial resource strain: None  . Food insecurity - worry: None  . Food insecurity - inability: None  . Transportation needs - medical: None  . Transportation needs - non-medical: None  Occupational History  . None  Tobacco Use  . Smoking status: Current Every Day Smoker    Packs/day: 1.00    Types: Cigarettes, Cigars  . Smokeless tobacco: Never Used  Substance and Sexual Activity  . Alcohol use: No  . Drug use: No  . Sexual activity: Not Currently  Other Topics Concern  . None  Social History Narrative  . None   Additional Social History:  Sleep: Fair  Appetite:  Fair  Current Medications: Current Facility-Administered Medications  Medication Dose Route Frequency Provider Last Rate Last Dose  . acetaminophen (TYLENOL) tablet 650 mg  650 mg Oral Q6H PRN Micheal Likensainville, Mohamadou Maciver T, MD   650 mg at 03/19/17 1834  . benztropine (COGENTIN) tablet 1 mg  1 mg Oral BID Micheal Likensainville, Shiven Junious T, MD   1 mg at 03/22/17 0740  . calcium carbonate (TUMS - dosed in mg elemental calcium) chewable tablet 200 mg of  elemental calcium  1 tablet Oral BID PRN Micheal Likens[place], Asianae Minkler T, MD   200 mg of elemental calcium at 03/22/17 0739  . gabapentin (NEURONTIN) capsule 600 mg  600 mg Oral BID Micheal Likensainville, Beverly Suriano T, MD   600 mg at 03/22/17 0739  . guaiFENesin (MUCINEX) 12 hr tablet 600 mg  600 mg Oral BID Jolyne Loaainville, Corsica Franson T, MD   600 mg at 03/22/17 0740  . haloperidol lactate (HALDOL) injection 5 mg  5 mg Intramuscular Q8H PRN Micheal Likensainville, Lillyth Spong T, MD   5 mg at 03/21/17 0951   And  . LORazepam (ATIVAN) injection 1 mg  1 mg Intramuscular Q8H PRN Micheal Likensainville, Hikari Tripp T, MD   1 mg at 03/21/17 0950  . hydrOXYzine (ATARAX/VISTARIL) tablet 50 mg  50 mg Oral BID Micheal Likens[place], Griffyn Kucinski T, MD   50 mg at 03/22/17 0740  . LORazepam (ATIVAN) tablet 1 mg  1 mg Oral Q6H PRN Kerry HoughSimon, Spencer E, PA-C      . multivitamin with minerals tablet 1 tablet  1 tablet Oral Daily Micheal Likensainville, Noreta Kue T, MD   1 tablet at 03/22/17 0740  . traZODone (DESYREL) tablet 50 mg  50 mg Oral QHS,MR X 1 Kerry HoughSimon, Spencer E, PA-C   Stopped at 03/10/17 0151  . ziprasidone (GEODON) capsule 80 mg  80 mg Oral BID WC Micheal Likensainville, Westly Hinnant T, MD   80 mg at 03/22/17 0740    Lab Results: No results found for this or any previous visit (from the past 48 hour(s)).  Blood Alcohol level:  Lab Results  Component Value Date   ETH <10 03/05/2017   ETH (H) 08/19/2010    <11        LOWEST DETECTABLE LIMIT FOR SERUM ALCOHOL IS 5 mg/dL FOR MEDICAL PURPOSES ONLY    Metabolic Disorder Labs: No results found for: HGBA1C, MPG No results found for: PROLACTIN No results found for: CHOL, TRIG, HDL, CHOLHDL, VLDL, LDLCALC  Physical Findings: AIMS: Facial and Oral Movements Muscles of Facial Expression: None, normal Lips and Perioral Area: None, normal Jaw: None, normal Tongue: None, normal,Extremity Movements Upper (arms, wrists, hands, fingers): None, normal Lower (legs, knees, ankles, toes): None, normal, Trunk Movements Neck, shoulders, hips:  None, normal, Overall Severity Severity of abnormal movements (highest score from questions above): None, normal Incapacitation due to abnormal movements: None, normal Patient's awareness of abnormal movements (rate only patient's report): No Awareness, Dental Status Current problems with teeth and/or dentures?: No Does patient usually wear dentures?: No  CIWA:  CIWA-Ar Total: 4 COWS:     Musculoskeletal: Strength & Muscle Tone: within normal limits Gait & Station: normal Patient leans: N/A  Psychiatric Specialty Exam: Physical Exam  Nursing note and vitals reviewed.   Review of Systems  Constitutional: Negative for chills and fever.  Respiratory: Negative for cough.   Cardiovascular: Negative for chest pain.  Gastrointestinal: Negative for heartburn and nausea.  Psychiatric/Behavioral: Negative for depression, hallucinations and suicidal ideas.    Blood pressure (!) 142/85, pulse 88, temperature 97.7 F (  36.5 C), temperature source Oral, resp. rate 20, height 5\' 8"  (1.727 m), weight 108.9 kg (240 lb).Body mass index is 36.49 kg/m.  General Appearance: Casual and Fairly Groomed  Eye Contact:  Good  Speech:  Clear and Coherent and Normal Rate  Volume:  Normal  Mood:  Irritable  Affect:  Appropriate, Congruent and Constricted  Thought Process:  Coherent and Goal Directed  Orientation:  Full (Time, Place, and Person)  Thought Content:  Logical  Suicidal Thoughts:  No  Homicidal Thoughts:  No  Memory:  Immediate;   Good Recent;   Good Remote;   Good  Judgement:  Fair  Insight:  Fair  Psychomotor Activity:  Normal  Concentration:  Concentration: Fair  Recall:  Fair  Fund of Knowledge:  Fair  Language:  Fair  Akathisia:  No  Handed:    AIMS (if indicated):     Assets:  Communication Skills Resilience Social Support  ADL's:  Intact  Cognition:  WNL  Sleep:  Number of Hours: 7.5     Treatment Plan Summary: Daily contact with patient to assess and evaluate  symptoms and progress in treatment and Medication management. Pt has been showing significant improvement of agitation and symptoms of mania, but she has some ongoing irritability. She agrees to continue her current regimen and to work with SW team about arranging for transportation to [place] after discharge in the coming days.  -Continue inpatient hospitalization (on IVC)  - Bipolar I current episode manic with psychotic features -Continue geodon 80mg  po BID with meals -Continuegabapentin 600mg  BID   - Continue cogentin 1mg  po BID - Continue haldol 5mg  po/IM q8h prn agitation with ativan 1mg  po/IM q8h prn agitation  -Anxiety - Continue atarax 50mg  po BID  - Insomnia - Continue trazodone 50mg  qhs prn insomnia   - Encourage participation in groups and therapeutic milieu   - Discharge planning will be ongoing   Micheal Likens, MD 03/22/2017, 1:46 PM

## 2017-03-22 NOTE — Plan of Care (Signed)
  Coping: Ability to demonstrate self-control will improve 03/22/2017 2021 - Progressing by Delos HaringPhillips, Kyleena Scheirer A, RN Note Pt has been pleasant and appropriate on the unit this evening   Safety: Periods of time without injury will increase 03/22/2017 2021 - Progressing by Delos HaringPhillips, Cyan Moultrie A, RN Note Pt safe on the unit at this time

## 2017-03-22 NOTE — Progress Notes (Signed)
Recreation Therapy Notes  Date: 03/22/17 Time: 1000 Location: 500 Hall Dayroom  Group Topic: Self-Expression  Goal Area(s) Addresses:  Patient will successfully identify positive attributes about themselves.  Patient will successfully identify benefit of improved self-expression.   Intervention: Black or brown Scientist, clinical (histocompatibility and immunogenetics)construction paper, black paint, crayons, paint brushes, straws  Activity: Scratch Art.  Patients were given a sheet of construction paper.  Patients were to take the crayons and color the paper with any colors, designs and shapes they chose.  Patients would then take the black paint and paint over their picture.  Lastly, patients would take a straw and scrap off the paint in any design they chose to reveal the colors underneath.   Education:  Self-Esteem, Building control surveyorDischarge Planning.   Education Outcome: Acknowledges education/In group clarification offered/Needs additional education  Clinical Observations/Feedback: Pt did not attend group.    Caroll RancherMarjette Lumi Winslett, LRT/CTRS         Caroll RancherLindsay, Unique Searfoss A 03/22/2017 11:57 AM

## 2017-03-23 MED ORDER — GABAPENTIN 300 MG PO CAPS: 600.0000 mg | ORAL_CAPSULE | Freq: Two times a day (BID) | ORAL | 0 refills | Status: DC

## 2017-03-23 MED ORDER — TRAZODONE HCL 100 MG PO TABS: ORAL_TABLET | ORAL | 0 refills | Status: AC

## 2017-03-23 MED ORDER — ACETAMINOPHEN 500 MG PO TABS: 1000.0000 mg | ORAL_TABLET | Freq: Two times a day (BID) | ORAL | 0 refills | Status: AC | PRN

## 2017-03-23 MED ORDER — CALCIUM CARBONATE ANTACID 500 MG PO CHEW: 1.0000 | CHEWABLE_TABLET | ORAL | Status: AC | PRN

## 2017-03-23 MED ORDER — TRAZODONE HCL 100 MG PO TABS: ORAL_TABLET | ORAL | 0 refills | Status: DC

## 2017-03-23 MED ORDER — LORAZEPAM 1 MG PO TABS: 1.0000 mg | ORAL_TABLET | Freq: Four times a day (QID) | ORAL | 0 refills | Status: AC | PRN

## 2017-03-23 MED ORDER — BENZTROPINE MESYLATE 1 MG PO TABS: 1.0000 mg | ORAL_TABLET | Freq: Two times a day (BID) | ORAL | 0 refills | Status: AC

## 2017-03-23 MED ORDER — ZIPRASIDONE HCL 80 MG PO CAPS: 80.0000 mg | ORAL_CAPSULE | Freq: Two times a day (BID) | ORAL | 0 refills | Status: DC

## 2017-03-23 MED ORDER — GABAPENTIN 300 MG PO CAPS: 600.0000 mg | ORAL_CAPSULE | Freq: Two times a day (BID) | ORAL | 0 refills | Status: AC

## 2017-03-23 MED ORDER — LORAZEPAM 1 MG PO TABS: 1.0000 mg | ORAL_TABLET | Freq: Four times a day (QID) | ORAL | 0 refills | Status: DC | PRN

## 2017-03-23 MED ORDER — HYDROXYZINE HCL 50 MG PO TABS: 50.0000 mg | ORAL_TABLET | Freq: Two times a day (BID) | ORAL | 0 refills | Status: AC

## 2017-03-23 MED ORDER — ZIPRASIDONE HCL 80 MG PO CAPS: 80.0000 mg | ORAL_CAPSULE | Freq: Two times a day (BID) | ORAL | 0 refills | Status: AC

## 2017-03-23 MED ORDER — TRAZODONE HCL 100 MG PO TABS
100.0000 mg | ORAL_TABLET | Freq: Every day | ORAL | Status: DC
  Filled 2017-03-23: qty 1

## 2017-03-23 NOTE — Progress Notes (Signed)
Recreation Therapy Notes  INPATIENT RECREATION TR PLAN  Patient Details Name: Rebecca Lozano MRN: [id number] DOB: 1980/07/07 Today's Date: 03/23/2017  Rec Therapy Plan Is patient appropriate for Therapeutic Recreation?: Yes Treatment times per week: about 3 days Estimated Length of Stay: 5-7 days TR Treatment/Interventions: Group participation (Comment)  Discharge Criteria Pt will be discharged from therapy if:: Discharged Treatment plan/goals/alternatives discussed and agreed upon by:: Patient/family  Discharge Summary Short term goals set: Pt will show improved self esteem by identifying positive qualities about herself. Short term goals met: Complete Progress toward goals comments: Groups attended Which groups?: Self-esteem, Anger management, Communication, Stress management Reason goals not met: None Therapeutic equipment acquired: N/A Reason patient discharged from therapy: Discharge from hospital Pt/family agrees with progress & goals achieved: Yes Date patient discharged from therapy: 03/23/17   Victorino Sparrow, LRT/CTRS  [name], [name] 03/23/2017, 1:07 PM

## 2017-03-23 NOTE — Progress Notes (Signed)
Pt got up agitated stating she was awakened by someone opening her door. Pt was irritable requesting vistaril, got upset with Clinical research associatewriter stating she will go back to her room and walked down the hall

## 2017-03-23 NOTE — Progress Notes (Signed)
  [facility] :  Will you be returning to the same living situation after discharge:  Yes,  streets [place] At discharge, do you have transportation home?: Yes,  bus pass Do you have the ability to pay for your medications: Yes,  mental health  Release of information consent forms completed and in the chart;  Patient's signature needed at discharge.  Patient to Follow up at: Follow-up Information    [facility] up.   Why:  In the event you end up staying in [place] longer than you anticipated, go to the walk-in clinic for your hospital follow up appointment. The [facility] should be open tonight as low is predicted at 25 degrees, same for tomorrow Contact information: [address] [place] [address] [phone]           Next level of care provider has [facility] to [facility] Link:no  Safety Planning and Suicide Prevention discussed: Yes,  yes  Have you used any form of tobacco in the last 30 days? (Cigarettes, Smokeless Tobacco, Cigars, and/or Pipes): Yes  Has patient been referred to the Quitline?: Patient refused referral  Patient has been referred for addiction treatment: N/A  Ida RogueRodney B Steve Gregg, LCSW 03/23/2017, 10:21 AM

## 2017-03-23 NOTE — Progress Notes (Signed)
Pt woke up pleasant and joking with Clinical research associatewriter, speaking spanish . Pt very appropriate this morning.

## 2017-03-23 NOTE — Plan of Care (Signed)
Pt was able to show improved self esteem by identifying positive qualities about herself at completion of self esteem recreation therapy session.  Caroll RancherMarjette Luke Rigsbee, LRT/CTRS

## 2017-03-23 NOTE — [facility] Suicide Risk Assessment (Signed)
[facility]   Principal Problem: Bipolar affective disorder, current episode manic with psychotic symptoms [facility](HCC) Discharge Diagnoses:  Patient Active Problem List   Diagnosis Date Noted  . Bipolar affective disorder, current episode manic with psychotic symptoms (HCC) [F31.2] 03/10/2017  . Psychosis (HCC) [F29] 03/09/2017    Total Time spent with patient: 30 minutes  Musculoskeletal: Strength & Muscle Tone: within normal limits Gait & Station: normal Patient leans: N/A  Psychiatric Specialty Exam: Review of Systems  Constitutional: Negative for chills and fever.  Respiratory: Negative for cough.   Cardiovascular: Negative for chest pain.  Gastrointestinal: Negative for heartburn and nausea.  Psychiatric/Behavioral: Negative for depression and suicidal ideas.    Blood pressure (!) 128/92, pulse 92, temperature 98.1 F (36.7 C), resp. rate 20, height 5\' 8"  (1.727 m), weight 108.9 kg (240 lb).Body mass index is 36.49 kg/m.  General Appearance: Casual and Fairly Groomed  Patent attorneyye Contact::  Good  Speech:  Clear and Coherent and Normal Rate  Volume:  Normal  Mood:  Irritable  Affect:  Appropriate, Congruent and Constricted  Thought Process:  Coherent and Goal Directed  Orientation:  Full (Time, Place, and Person)  Thought Content:  Logical  Suicidal Thoughts:  No  Homicidal Thoughts:  No  Memory:  Immediate;   Fair Recent;   Fair Remote;   Fair  Judgement:  Fair  Insight:  Lacking  Psychomotor Activity:  Normal  Concentration:  Fair  Recall:  FiservFair  Fund of Knowledge:Fair  Language: Fair  Akathisia:  No  Handed:    AIMS (if indicated):     Assets:  Manufacturing systems engineerCommunication Skills Physical Health Resilience Social Support  Sleep:  Number of Hours: 4.25  Cognition: WNL  ADL's:  Intact   Mental Status Per Nursing Assessment::   On Admission:  NA  Demographic Factors:  Low socioeconomic status and Unemployed  Loss Factors: Financial problems/change  in socioeconomic status  Historical Factors: Impulsivity  Risk Reduction Factors:   Positive coping skills or problem solving skills  Continued Clinical Symptoms:  Bipolar Disorder:   Mixed State  Cognitive Features That Contribute To Risk:  Closed-mindedness and Thought constriction (tunnel vision)    Suicide Risk:  Minimal: No identifiable suicidal ideation.  Patients presenting with no risk factors but with morbid ruminations; may be classified as minimal risk based on the severity of the depressive symptoms  Follow-up Information    [facility] Follow up.   Why:  In the event you end up staying in [place] longer than you anticipated, go to the walk-in clinic for your hospital follow up appointment. The [facility] should be open tonight as low is predicted at 25 degrees, same for tomorrow Contact information: [address] [place] [address] [phone]         Subjective Data:  Rebecca Lozano is a 37 y/o F with history of bipolar disorder who was [facility] from [facility] where she was brought in by police after observed walking in street with disorganized behaviors, irritability, agitation, and SI.In ED pt waslabile, irritable, disorganized, and refusing medications. Pt had ongoing agitation on the unit. Second opinionobtained for forced medications on12/28/18, and pt was transitioned to previous home medication of geodonwhich was titrated to dose of 80mg  BID with meals.  Pt had improvement of agitation, but she continues to have some irritability.   Todayat interview, pt is focused on discharge. She shares that her plan is [place] back [place] where she will stay in a  homeless shelter and establish services there. She does not want our assistance in obtaining a bus ticket or other services in the [place] area. She denies SI/HI/AH/VH. She states her plan is to buy some better shoes than her current slippers and then stay in a motel tonight. She was also open to the idea of  staying in a shelter to conserve her resources, and she had good awareness of [facility] as she has been there before, and pt stated that she planned to line up at [facility] at [facility]. Pt agrees to continue her current medication regimen without changes. She was able to engage in safety planning including plan to return to [facility] if she feels unable to maintain her own safety or the safety of others. She had no further questions, comments, or concerns.   Plan Of Care/Follow-up recommendations:   -Discharge to outpatient level of care  - Bipolar I current episode manic with psychotic features -Continuegeodon 80mg  po BID with meals -Continuegabapentin 600mg  BID             - Continue cogentin 1mg  po BID  -Anxiety - Continue atarax 50mg  po BID  - Insomnia - Continue trazodone 50mg  qhs prn insomnia  Activity:  As tolerated Diet:  normal Tests:  NA Other:  See above for DC plan  Micheal Likens, MD 03/23/2017, 10:35 AM

## 2017-03-23 NOTE — Discharge Summary (Signed)
Physician Discharge Summary Note  Patient:  Rebecca Lozano is an 37 y.o., female MRN:  [id number]  DOB:  1980-04-20  Patient phone:  [phone] (home)   Patient address:   [facility] [place] [address],   Total Time spent with patient: Greater than 30 minutes  Date of Admission:  03/09/2017  Date of Discharge: 03-23-16  Reason for Admission: Disorganized behavior while making suicidal statements.  Principal Problem: Bipolar affective disorder, current episode manic with psychotic symptoms [facility])  Discharge Diagnoses: Patient Active Problem List   Diagnosis Date Noted  . Bipolar affective disorder, current episode manic with psychotic symptoms (HCC) [F31.2] 03/10/2017  . Psychosis (HCC) [F29] 03/09/2017   Past Psychiatric History: Bipolar disorder, manic episodes.  Past Medical History:  Past Medical History:  Diagnosis Date  . Bipolar 1 disorder (HCC)   . CHF (congestive heart failure) (HCC)   . GERD (gastroesophageal reflux disease)   . Hypertension   . Seizures (HCC)     Past Surgical History:  Procedure Laterality Date  . NO PAST SURGERIES     Family History: History reviewed. No pertinent family history.  Family Psychiatric  History: See H&P  Social History:  Social History   Substance and Sexual Activity  Alcohol Use No     Social History   Substance and Sexual Activity  Drug Use No    Social History   Socioeconomic History  . Marital status: Single    Spouse name: None  . Number of children: None  . Years of education: None  . Highest education level: None  Social Needs  . Financial resource strain: None  . Food insecurity - worry: None  . Food insecurity - inability: None  . Transportation needs - medical: None  . Transportation needs - non-medical: None  Occupational History  . None  Tobacco Use  . Smoking status: Current Every Day Smoker    Packs/day: 1.00    Types: Cigarettes, Cigars  . Smokeless tobacco: Never Used  Substance and  Sexual Activity  . Alcohol use: No  . Drug use: No  . Sexual activity: Not Currently  Other Topics Concern  . None  Social History Narrative  . None   Hospital Course: Rebecca Lozano is a 37 y/o F with history of bipolar disorder who was admitted on IVC to [facility] after she was observed by police with disorganized behaviors and walking in traffic. As per IVC, pt had made statement that she wanted to kill herself. She was then aggressive towards ED staff and locked herself in the ED bathroom. Pt was observed talking to herself, responding to internal stimuli, and screaming at ED staff which required use of PRN IM medications to control her agitation.  Pt was transferred to [facility] for additional treatment and stabilization. Upon interview today, pt is irritable, guarded, dysphoric, paranoid, and minimally cooperative. Pt was asked to share reasons for her admission and she gave circumstantial responses about how she has been homeless since 2012 due to being given abilify which caused her to have a "heart attack."   Rebecca Lozano was admitted to the [facility] adult unit for crisis management due to worsening symptoms of Bipolar 1 disorder with severe psychotic features, disorganized behaviors & suicidal threats. She was also facing other significant stressors such as homelessness.support system besides the mental illness. She was in need of mood stabilization treatments.   After evaluation of her presenting symptoms, Rebecca Lozano was started on the medication regimen for those presenting symptoms. Their indications & side  effects were explained to her. However, she was very disruptive, loud, responding to some internal stimuli making it very difficult to teach, explain anything to her or get her to cooperate during care. She received & was however discharged on; Cogentin 1 mg for EPS, Hydroxyzine 50 mg prn for anxiety, Gabapentin 300 mg for agitation, Lorazepam 1 mg prn for severe anxiety, Trazodone 100 mg for insomnia & Geodon 80 mg for  mood control. Her other pre-existing medical problems were identified & treated accordingly by resuming her pertinent home medications as deemed appropriate.   During the course of her hosptalization, Rebecca Lozano's improvement was monitored by daily observation as her symptoms escalated from severe to unpredictable then to mild. She was noted on numerous occasions having a full conversation with herself, talking/laughing loudly to refusing to work with staff. It took quite some time, but her symptoms eventually responded well to her treatment regimen. Her continued improvement was observed on daily basis as her lashing out, talking loud, having full conversation with herself continued to diminish. She was enrolled & encouraged to attend group seesions to help with recognizing the triggers of her emotional crises & ways to cope better with them. Rebecca Lozano comes & goes as she pleased during group sessions.        During the course of her hospitalization, Rebecca Lozano was evaluated by the treatment team on daily basis for mood stability and plans for continued recovery after discharge. She was offered further treatment options upon discharge on an outpatient basis as noted below. She was encouraged to maintain satisfactory support network & home environment as this will aid in her maintaining mood stability. She was instructed & encouraged to adhere to her medication regimen as recommended by her treatment team.    Rebecca Lozano was seen this morning by the attending psychiatrist. Her reason for admission, treatment plans & response to treatment were discussed. She endorsed that she is doing well & ready to be discharged to continue mental health care on an outpatient basis as noted below. She was provided with all the necessary information needed to make this appointment without problems.   Upon discharge, Rebecca Lozano was both mentally and medically stable denying suicidal/homicidal ideation, auditory/visual/tactile hallucinations, delusional  thoughts and paranoia. She received from the [facility] pharmacy, a 7 days worth supply samples of her [facility] discharge medications. She left [facility] with all personal belongings in no distress. Transportation per the city bus. [facility] assisted with bus pass..   Physical Findings: AIMS: Facial and Oral Movements Muscles of Facial Expression: None, normal Lips and Perioral Area: None, normal Jaw: None, normal Tongue: None, normal,Extremity Movements Upper (arms, wrists, hands, fingers): None, normal Lower (legs, knees, ankles, toes): None, normal, Trunk Movements Neck, shoulders, hips: None, normal, Overall Severity Severity of abnormal movements (highest score from questions above): None, normal Incapacitation due to abnormal movements: None, normal Patient's awareness of abnormal movements (rate only patient's report): No Awareness, Dental Status Current problems with teeth and/or dentures?: No Does patient usually wear dentures?: No  CIWA:  CIWA-Ar Total: 4 COWS:     Musculoskeletal: Strength & Muscle Tone: within normal limits Gait & Station: normal Patient leans: N/A  Psychiatric Specialty Exam: Physical Exam  Constitutional: She appears well-developed.  HENT:  Head: Normocephalic.  Eyes: Pupils are equal, round, and reactive to light.  Neck: Normal range of motion.  Respiratory: Effort normal.  GI: Soft.  Genitourinary:  Genitourinary Comments: Deferred  Musculoskeletal: Normal range of motion.  Neurological: She is alert.  Skin: Skin  is warm.    Review of Systems  Constitutional: Negative.   HENT: Negative.   Eyes: Negative.   Respiratory: Negative.   Cardiovascular: Negative.   Gastrointestinal: Negative.   Genitourinary: Negative.   Musculoskeletal: Negative.   Skin: Negative.   Neurological: Negative.   Endo/Heme/Allergies: Negative.   Psychiatric/Behavioral: Positive for depression (Stable) and hallucinations (Hx. psychosis). Negative for memory loss, substance abuse and  suicidal ideas. The patient has insomnia (Stable). The patient is not nervous/anxious.     Blood pressure (!) 128/92, pulse 92, temperature 98.1 F (36.7 C), resp. rate 20, height 5\' 8"  (1.727 m), weight 108.9 kg (240 lb).Body mass index is 36.49 kg/m.  See Md's SRa   Have you used any form of tobacco in the last 30 days? (Cigarettes, Smokeless Tobacco, Cigars, and/or Pipes): Yes  Has this patient used any form of tobacco in the last 30 days? (Cigarettes, Smokeless Tobacco, Cigars, and/or Pipes): N/A  Blood Alcohol level:  Lab Results  Component Value Date   ETH <10 03/05/2017   ETH (H) 08/19/2010    <11        LOWEST DETECTABLE LIMIT FOR SERUM ALCOHOL IS 5 mg/dL FOR MEDICAL PURPOSES ONLY   Metabolic Disorder Labs:  No results found for: HGBA1C, MPG No results found for: PROLACTIN No results found for: CHOL, TRIG, HDL, CHOLHDL, VLDL, LDLCALC  See Psychiatric Specialty Exam and Suicide Risk Assessment completed by Attending Physician prior to discharge.  Discharge destination:  Home  Is patient on multiple antipsychotic therapies at discharge:  No   Has Patient had three or more failed trials of antipsychotic monotherapy by history:  No  Recommended Plan for Multiple Antipsychotic Therapies: NA  Allergies as of 03/23/2017      Reactions   Penicillins Other (See Comments)   Reaction unknown Has patient had a PCN reaction causing immediate rash, facial/tongue/throat swelling, SOB or lightheadedness with hypotension: unknown Has patient had a PCN reaction causing severe rash involving mucus membranes or skin necrosis: unknown Has patient had a PCN reaction that required hospitalization: unknown Has patient had a PCN reaction occurring within the last 10 years: unknown If all of the above answers are "NO", then may proceed with Cephalosporin use.      Medication List    STOP taking these medications   b complex vitamins capsule   Melatonin 3 MG Caps     TAKE these  medications     Indication  acetaminophen 500 MG tablet Commonly known as:  TYLENOL Take 2 tablets (1,000 mg total) by mouth 2 (two) times daily as needed (pain).  Indication:  Fever, Pain   benztropine 1 MG tablet Commonly known as:  COGENTIN Take 1 tablet (1 mg total) by mouth 2 (two) times daily. For prevention of drug induced tremors  Indication:  Extrapyramidal Reaction caused by Medications   calcium carbonate 500 MG chewable tablet Commonly known as:  TUMS - dosed in mg elemental calcium Chew 1 tablet (200 mg of elemental calcium total) by mouth as needed for indigestion or heartburn.  Indication:  Heartburn   gabapentin 300 MG capsule Commonly known as:  NEURONTIN Take 2 capsules (600 mg total) by mouth 2 (two) times daily. For agitation  Indication:  Agitation   hydrOXYzine 50 MG tablet Commonly known as:  ATARAX/VISTARIL Take 1 tablet (50 mg total) by mouth 2 (two) times daily. For anxiety  Indication:  Feeling Anxious   LORazepam 1 MG tablet Commonly known as:  ATIVAN Take 1 tablet (  1 mg total) by mouth every 6 (six) hours as needed for anxiety.  Indication:  Agitation, Feeling Anxious   traZODone 100 MG tablet Commonly known as:  DESYREL Take 1 tablet (100 mg) by mouth at bedtime: For sleep  Indication:  Trouble Sleeping   ziprasidone 80 MG capsule Commonly known as:  GEODON Take 1 capsule (80 mg total) by mouth 2 (two) times daily with a meal. For mood control  Indication:  Mood control      Follow-up Information    [facility] Follow up.   Why:  In the event you end up staying in [place] longer than you anticipated, go to the walk-in clinic for your hospital follow up appointment. The [facility] should be open tonight as low is predicted at 25 degrees, same for tomorrow Contact information: [address] [place] [address] [phone]          Follow-up recommendations: Activity:  As tolerated Diet: As recommended by your primary care doctor. Keep  all scheduled follow-up appointments as recommended.  Comments: Patient is instructed prior to discharge to: Take all medications as prescribed by his/her mental healthcare provider. Report any adverse effects and or reactions from the medicines to his/her outpatient provider promptly. Patient has been instructed & cautioned: To not engage in alcohol and or illegal drug use while on prescription medicines. In the event of worsening symptoms, patient is instructed to call the crisis hotline, 911 and or go to the nearest ED for appropriate evaluation and treatment of symptoms. To follow-up with his/her primary care provider for your other medical issues, concerns and or health care needs.   Signed: Armandina Stammer, NP, PMHNP, FNP-BC 03/23/2017, 11:10 AM   Patient seen, Suicide Assessment Completed.  Disposition Plan Reviewed    Aariah Sarsfield is a 37 y/o F with history of bipolar disorder who was admitted on IVC from [facility] where she was brought in by police after observed walking in street with disorganized behaviors, irritability, agitation, and SI.In ED pt waslabile, irritable, disorganized, and refusing medications. Pt had ongoing agitation on the unit. Second opinionobtained for forced medications on12/28/18, and pt was transitioned to previous home medication of geodonwhich was titrated to dose of 80mg  BID with meals.Pt had improvement of agitation, but she continues to have some irritability.  Todayat interview, pt is focused on discharge. She shares that her plan is to go back to [place] where she will stay in a homeless shelter and establish services there. She does not want our assistance in obtaining a bus ticket or other services in the [place] area. She denies SI/HI/AH/VH. She states her plan is to buy some better shoes than her current slippers and then stay in a motel tonight. She was also open to the idea of staying in a shelter to conserve her resources, and she had good awareness  of [facility] as she has been there before, and pt stated that she planned to line up at [facility]. Pt agrees to continue her current medication regimen without changes. She was able to engage in safety planning including plan to return to [facility] if she feels unable to maintain her own safety or the safety of others. She had no further questions, comments, or concerns.   Plan Of Care/Follow-up recommendations:   -Discharge to outpatient level of care  - Bipolar I current episode manic with psychotic features -Continuegeodon 80mg  po BID with meals -Continuegabapentin600mg  BID - Continue cogentin 1mg  po BID  -Anxiety - Continue atarax  50mg  po BID  - Insomnia - Continue trazodone 50mg  qhs prn insomnia  Activity:  As tolerated Diet:  normal Tests:  NA Other:  See above for DC plan  Micheal Likens, MD

## 2017-03-23 NOTE — Progress Notes (Signed)
Recreation Therapy Notes  Date: 03/23/17 Time: 1000 Location: 500 Hall Dayroom  Group Topic: Anger Management  Goal Area(s) Addresses:  Patient will identify triggers for anger.  Patient will identify physical reaction to anger.   Patient will identify benefit of using coping skills when angry.  Behavioral Response: Engaged  Intervention: Worksheet  Activity: Intro to Anger Management.  LRT gave pt a worksheet on anger.  Patients were to identify at least 3 situations that lead to feelings of anger, ways they act differently when angry and problems caused by anger.  Education: Anger Management, Discharge Planning   Education Outcome: Acknowledges education/In group clarification offered/Needs additional education.   Clinical Observations/Feedback: Pt was appropriate and on task.  Pt stated the things that lead to her anger are "family members that have taken my identity away from me and people treating my like a child".  Pt expressed she reacts to anger by venting, let the person know she doesn't like what they are doing, pray, change attitude/circle.  Pt explained she was IVC'd because of other people's anger.    Caroll RancherMarjette Caedin Mogan, LRT/CTRS      Caroll RancherLindsay, Debbrah Sampedro A 03/23/2017 12:32 PM

## 2017-03-23 NOTE — Progress Notes (Signed)
Patient ID: Rebecca Lozano, female   DOB: 1981/02/01, 37 y.o.   MRN: [id number] Patient discharged to home/self care.  Patient acknowledged understanding of discharge planning and receipt of all belongings.  Patient denies SI, HI and AVH upon discharge.

## 2017-03-25 ENCOUNTER — Other Ambulatory Visit: Payer: Self-pay

## 2017-03-25 ENCOUNTER — Encounter (HOSPITAL_COMMUNITY): Payer: Self-pay | Admitting: Emergency Medicine

## 2017-03-25 DIAGNOSIS — Z5321 Procedure and treatment not carried out due to patient leaving prior to being seen by health care provider: Secondary | ICD-10-CM | POA: Insufficient documentation

## 2017-03-25 DIAGNOSIS — R109 Unspecified abdominal pain: Secondary | ICD-10-CM | POA: Diagnosis not present

## 2017-03-25 LAB — URINALYSIS, ROUTINE W REFLEX MICROSCOPIC
BILIRUBIN URINE: NEGATIVE
GLUCOSE, UA: NEGATIVE mg/dL
KETONES UR: NEGATIVE mg/dL
LEUKOCYTES UA: NEGATIVE
NITRITE: NEGATIVE
Protein, ur: NEGATIVE mg/dL
Specific Gravity, Urine: 1.01 (ref 1.005–1.030)
pH: 6 (ref 5.0–8.0)

## 2017-03-25 LAB — COMPREHENSIVE METABOLIC PANEL
ALK PHOS: 58 U/L (ref 38–126)
ALT: 88 U/L — ABNORMAL HIGH (ref 14–54)
ANION GAP: 9 (ref 5–15)
AST: 57 U/L — ABNORMAL HIGH (ref 15–41)
Albumin: 4 g/dL (ref 3.5–5.0)
BILIRUBIN TOTAL: 0.7 mg/dL (ref 0.3–1.2)
BUN: 8 mg/dL (ref 6–20)
CO2: 22 mmol/L (ref 22–32)
Calcium: 9.2 mg/dL (ref 8.9–10.3)
Chloride: 105 mmol/L (ref 101–111)
Creatinine, Ser: 0.79 mg/dL (ref 0.44–1.00)
GFR calc non Af Amer: >60 mL/min (ref >60)
Glucose, Bld: 113 mg/dL — ABNORMAL HIGH (ref 65–99)
Potassium: 4 mmol/L (ref 3.5–5.1)
SODIUM: 136 mmol/L (ref 135–145)
TOTAL PROTEIN: 7.2 g/dL (ref 6.5–8.1)

## 2017-03-25 LAB — CBC WITH DIFFERENTIAL/PLATELET
Basophils Absolute: 0 10*3/uL (ref 0.0–0.1)
Basophils Relative: 0 %
EOS ABS: 0.3 10*3/uL (ref 0.0–0.7)
Eosinophils Relative: 3 %
HCT: 26.7 % — ABNORMAL LOW (ref 36.0–46.0)
HEMOGLOBIN: 8.4 g/dL — AB (ref 12.0–15.0)
LYMPHS ABS: 3.6 10*3/uL (ref 0.7–4.0)
Lymphocytes Relative: 40 %
MCH: 24 pg — AB (ref 26.0–34.0)
MCHC: 31.5 g/dL (ref 30.0–36.0)
MCV: 76.3 fL — ABNORMAL LOW (ref 78.0–100.0)
MONO ABS: 0.6 10*3/uL (ref 0.1–1.0)
MONOS PCT: 7 %
NEUTROS PCT: 50 %
Neutro Abs: 4.4 10*3/uL (ref 1.7–7.7)
Platelets: 515 10*3/uL — ABNORMAL HIGH (ref 150–400)
RBC: 3.5 MIL/uL — ABNORMAL LOW (ref 3.87–5.11)
RDW: 16.8 % — AB (ref 11.5–15.5)
WBC: 8.8 10*3/uL (ref 4.0–10.5)

## 2017-03-25 LAB — I-STAT BETA HCG BLOOD, ED (MC, WL, AP ONLY): I-stat hCG, quantitative: <5 m[IU]/mL (ref <5)

## 2017-03-25 NOTE — ED Triage Notes (Signed)
Pt talking to self in room.

## 2017-03-25 NOTE — ED Triage Notes (Signed)
Pt to ED with abd pain and flank pain radiating down left leg.  Pt yelling, screaming and cursing staff

## 2017-03-26 ENCOUNTER — Emergency Department (HOSPITAL_COMMUNITY)
Admission: EM | Admit: 2017-03-26 | Discharge: 2017-03-26 | Disposition: A | Discharge status: Home / Self Care | Payer: Medicare (Managed Care) | Attending: Emergency Medicine | Admitting: Emergency Medicine

## 2017-03-26 NOTE — ED Notes (Signed)
Pt stormed out of room stating she didn't want to be seen

## 2017-07-01 DIAGNOSIS — F3163 Bipolar disorder, current episode mixed, severe, without psychotic features: Principal | ICD-10-CM
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
## 2017-07-01 NOTE — ED Notes (Signed)
Jennifer w/[facility] at the bedside.

## 2017-07-01 NOTE — ED Notes (Signed)
Pt is sleeping. [facility] now [facility]

## 2017-07-01 NOTE — ED Notes (Signed)
Pt is cooperative at times but then changes mood quickly and becomes loud and very vocal. Pt is confused right now and assessment was difficult. Pt is laughing and talking to an imaginary person.

## 2017-07-01 NOTE — ED Notes (Signed)
Pt has been seen by [facility].
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
## 2017-07-01 NOTE — Other (Signed)
Comprehensive Assessment Form Part 1    Section I - Disposition    Axis I - Bipolar Disorder   Axis II - Deferred  Axis III - Hypertension, CHF, Stroke, Seizure disorder  Axis IV - Homeless, Lack of support system, Lack of structure  Axis V - 35      The Medical Doctor to Psychiatrist conference was not completed.  The Medical Doctor is in agreement with Psychiatrist disposition because of (reason) patient is requesting admission.  The plan is admit to [facility] acute.  The on-call Psychiatrist consulted was Dr. Barnett Abu.  The admitting Psychiatrist will be Dr. Barnett Abu.  The admitting Diagnosis is Bipolar Disorder.  The Payor source is Medicaid.    Section II - Integrated Summary  Summary:  Patient is a 37 year old female seen face to face in the ER.  She came to the ER reporting suicidal ideation without a plan.  She reported she was psychiatrically admitted to [facility] in March and upon discharge was "sent to a crack house" to live so she left.  She continuously spoke about being "tired of reaching out for help and being turned away."  She reported she has been in [place] since 2016.  She has been homeless a good part of this time.  She was very focused on not wanting to be around "crack heads" and having issues with housing being around people using crack.  She reported she feels hopeless.  She denied homicidal ideation or symptoms of psychosis.  However, she was observed by nursing staff apparently responding to internal stimuli.  Patient appears to be manic, as she is very irritable and at times made statement that were difficult to follow.  She reported she had a stroke in 2018 and since then "anything I take can upset my brain," so she hasn't taken medication consistently since then.  She reported she has a history of suicide attempts but would not provide any information about what she did or when they occurred.  She talked about [facility] not helping her, so [facility] was  contacted for collateral.  They reported she has a history of Schizoaffective Disorder Bipolar Type and was closed to services in May 2018 due to treatment noncompliance.  Patient is requesting admission.  Since we have no information about her baseline and level of irritability on a day to day basis, she will be admitted for treatment.    The patient has demonstrated mental capacity to provide informed consent.  The information is given by the patient and [facility] crisis.  The Chief Complaint is mental health problem.  The Precipitant Factors are homelessness, lack of support system, lack of structure, treatment noncompliance.  Previous Hospitalizations: yes  The patient has not previously been in restraints.  Current Psychiatrist and/or Case Manager is NA.    Lethality Assessment:    The potential for suicide is noted by the following: previous history of attempts and ideation.  The potential for homicide is not noted.  The patient has not been a perpetrator of sexual or physical abuse.  There are not pending charges.  The patient is felt to be at risk for self harm or harm to others.  The attending nurse was advised the patient needs supervision.    Section III - Psychosocial  The patient's overall mood and attitude is irritable.  Feelings of helplessness and hopelessness are observed by patient's self report.  Generalized anxiety is not observed.  Panic is not observed. Phobias are not observed.  Obsessive  compulsive tendencies are not observed.      Section IV - Mental Status Exam  The patient's appearance shows no evidence of impairment.  The patient's behavior is guarded and shows poor eye contact. The patient is oriented to time, place, person and situation.  The patient's speech is pressured.  The patient's mood is irritable.  The range of affect is constricted.  The patient's thought content demonstrates paranoia.  The thought process shows no evidence of impairment.  The patient's perception demonstrated  changes in the following:  auditory hallucinations. The patient's memory shows no evidence of impairment.  The patient's appetite is decreased.  The patient's sleep has evidence of insomnia. The patient's insight is blaming.  The patient's judgement is psychologically impaired.      Section V - Substance Abuse  The patient is using substances.  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 The patient has experienced the following withdrawal symptoms: N/A.    Section VI - Living Arrangements  The patient is single.  The patient is homeless. The patient has no children.  The patient does not plan to return home upon discharge.  The patient does not have legal issues pending. The patient's source of income comes from disability.  Religious and cultural practices have not been voiced at this time.    The patient's greatest support comes from nobody and this person will not be involved with the treatment.    The patient has been in an event described as horrible or outside the realm of ordinary life experience either currently or in the past.  The patient has not been a victim of sexual/physical abuse.    Section VII - Other Areas of Clinical Concern  The highest grade achieved is unknown with the overall quality of school experience being described as unknown.  The patient is currently disabled and speaks [place] as a primary language.  The patient has no communication impairments affecting communication. The patient's preference for learning can be described as: can read and write adequately.  The patient's hearing is normal.  The patient's vision is normal.      Joan HitchJennifer A Molina

## 2017-07-01 NOTE — ED Notes (Signed)
Pt is accepted by Dr Barnett AbuWiseman. She has a bed allocated room 313 bed 2.

## 2017-07-02 ENCOUNTER — Inpatient Hospital Stay
Admit: 2017-07-02 | Discharge: 2017-07-11 | Disposition: A | Discharge status: Home / Self Care | Payer: MEDICARE | Attending: Psychiatry | Admitting: Psychiatry

## 2017-07-02 LAB — METABOLIC PANEL, COMPREHENSIVE
A-G Ratio: 1 — ABNORMAL LOW (ref 1.1–2.2)
ALT (SGPT): 20 U/L (ref 12–78)
AST (SGOT): 21 U/L (ref 15–37)
Albumin: 3.8 g/dL (ref 3.5–5.0)
Alk. phosphatase: 69 U/L (ref 45–117)
Anion gap: 9 mmol/L (ref 5–15)
BUN/Creatinine ratio: 9 — ABNORMAL LOW (ref 12–20)
BUN: 7 MG/DL (ref 6–20)
Bilirubin, total: 0.2 MG/DL (ref 0.2–1.0)
CO2: 25 mmol/L (ref 21–32)
Calcium: 8.7 MG/DL (ref 8.5–10.1)
Chloride: 105 mmol/L (ref 97–108)
Creatinine: 0.79 MG/DL (ref 0.55–1.02)
GFR est AA: >60 mL/min/{1.73_m2} (ref >60)
GFR est non-AA: >60 mL/min/{1.73_m2} (ref >60)
Globulin: 3.8 g/dL (ref 2.0–4.0)
Glucose: 98 mg/dL (ref 65–100)
Potassium: 3.7 mmol/L (ref 3.5–5.1)
Protein, total: 7.6 g/dL (ref 6.4–8.2)
Sodium: 139 mmol/L (ref 136–145)

## 2017-07-02 LAB — CBC WITH AUTOMATED DIFF
ABS. BASOPHILS: 0 10*3/uL (ref 0.0–0.1)
ABS. EOSINOPHILS: 0.4 10*3/uL (ref 0.0–0.4)
ABS. IMM. GRANS.: 0 10*3/uL (ref 0.00–0.04)
ABS. LYMPHOCYTES: 3.2 10*3/uL (ref 0.8–3.5)
ABS. MONOCYTES: 0.5 10*3/uL (ref 0.0–1.0)
ABS. NEUTROPHILS: 2.6 10*3/uL (ref 1.8–8.0)
ABSOLUTE NRBC: 0 10*3/uL (ref 0.00–0.01)
BASOPHILS: 0 % (ref 0–1)
EOSINOPHILS: 6 % (ref 0–7)
HCT: 37.6 % (ref 35.0–47.0)
HGB: 11.9 g/dL (ref 11.5–16.0)
IMMATURE GRANULOCYTES: 0 % (ref 0.0–0.5)
LYMPHOCYTES: 47 % (ref 12–49)
MCH: 27.7 PG (ref 26.0–34.0)
MCHC: 31.6 g/dL (ref 30.0–36.5)
MCV: 87.6 FL (ref 80.0–99.0)
MONOCYTES: 8 % (ref 5–13)
MPV: 8.9 FL (ref 8.9–12.9)
NEUTROPHILS: 39 % (ref 32–75)
NRBC: 0 PER 100 WBC
PLATELET: 300 10*3/uL (ref 150–400)
RBC: 4.29 M/uL (ref 3.80–5.20)
RDW: 16.4 % — ABNORMAL HIGH (ref 11.5–14.5)
WBC: 6.7 10*3/uL (ref 3.6–11.0)

## 2017-07-02 LAB — URINALYSIS W/ REFLEX CULTURE
Bacteria: NEGATIVE /hpf
Bilirubin: NEGATIVE
Glucose: NEGATIVE mg/dL
Ketone: NEGATIVE mg/dL
Leukocyte Esterase: NEGATIVE
Nitrites: NEGATIVE
Specific gravity: 1.03 (ref 1.003–1.030)
Urobilinogen: 1 EU/dL (ref 0.2–1.0)
pH (UA): 5.5 (ref 5.0–8.0)

## 2017-07-02 LAB — DRUG SCREEN, URINE
AMPHETAMINES: NEGATIVE
BARBITURATES: NEGATIVE
BENZODIAZEPINES: NEGATIVE
COCAINE: NEGATIVE
METHADONE: NEGATIVE
OPIATES: NEGATIVE
PCP(PHENCYCLIDINE): NEGATIVE
THC (TH-CANNABINOL): POSITIVE — AB

## 2017-07-02 LAB — ETHYL ALCOHOL: ALCOHOL(ETHYL),SERUM: <10 MG/DL (ref <10)

## 2017-07-02 LAB — HCG URINE, QL. - POC: Pregnancy test,urine (POC): NEGATIVE

## 2017-07-02 MED ORDER — LORAZEPAM 1 MG TAB
1 mg | ORAL | Status: DC | PRN
Start: 2017-07-02 — End: 2017-07-04

## 2017-07-02 MED ORDER — BENZTROPINE 1 MG/ML IJ SOLN
1 mg/mL | Freq: Two times a day (BID) | INTRAMUSCULAR | Status: DC | PRN
Start: 2017-07-02 — End: 2017-07-11

## 2017-07-02 MED ORDER — LORAZEPAM 2 MG/ML IJ SOLN
2 mg/mL | INTRAMUSCULAR | Status: DC | PRN
Start: 2017-07-02 — End: 2017-07-11
  Administered 2017-07-05: 12:00:00 via INTRAMUSCULAR

## 2017-07-02 MED ORDER — ACETAMINOPHEN 325 MG TABLET
325 mg | ORAL | Status: DC | PRN
Start: 2017-07-02 — End: 2017-07-11

## 2017-07-02 MED ORDER — PANTOPRAZOLE 40 MG TAB, DELAYED RELEASE
40 mg | Freq: Every day | ORAL | Status: DC
Start: 2017-07-02 — End: 2017-07-11
  Administered 2017-07-06: 13:00:00 via ORAL

## 2017-07-02 MED ORDER — NICOTINE 21 MG/24 HR DAILY PATCH
21 mg/24 hr | Freq: Every day | TRANSDERMAL | Status: DC | PRN
Start: 2017-07-02 — End: 2017-07-08

## 2017-07-02 MED ORDER — TRAZODONE 50 MG TAB
50 mg | Freq: Every evening | ORAL | Status: DC | PRN
Start: 2017-07-02 — End: 2017-07-07

## 2017-07-02 MED ORDER — OLANZAPINE 5 MG TAB
5 mg | Freq: Two times a day (BID) | ORAL | Status: DC
Start: 2017-07-02 — End: 2017-07-04

## 2017-07-02 MED ORDER — LISINOPRIL 5 MG TAB
5 mg | Freq: Every day | ORAL | Status: DC
Start: 2017-07-02 — End: 2017-07-11

## 2017-07-02 MED ORDER — BENZTROPINE 2 MG TAB
2 mg | Freq: Two times a day (BID) | ORAL | Status: DC | PRN
Start: 2017-07-02 — End: 2017-07-11

## 2017-07-02 MED ORDER — HYDROXYZINE 25 MG TAB
25 mg | Freq: Four times a day (QID) | ORAL | Status: DC | PRN
Start: 2017-07-02 — End: 2017-07-11
  Administered 2017-07-02 – 2017-07-07 (×7): via ORAL

## 2017-07-02 MED ORDER — OLANZAPINE 5 MG TAB
5 mg | Freq: Four times a day (QID) | ORAL | Status: DC | PRN
Start: 2017-07-02 — End: 2017-07-03

## 2017-07-02 MED ORDER — MAGNESIUM HYDROXIDE 400 MG/5 ML ORAL SUSP
400 mg/5 mL | Freq: Every day | ORAL | Status: DC | PRN
Start: 2017-07-02 — End: 2017-07-07

## 2017-07-02 MED ORDER — IBUPROFEN 400 MG TAB
400 mg | Freq: Three times a day (TID) | ORAL | Status: DC | PRN
Start: 2017-07-02 — End: 2017-07-11

## 2017-07-02 MED ORDER — ZIPRASIDONE MESYLATE 20 MG IM SOLR
20. mg/mL (final conc.) | Freq: Two times a day (BID) | INTRAMUSCULAR | Status: DC | PRN
Start: 2017-07-02 — End: 2017-07-03

## 2017-07-02 MED FILL — LISINOPRIL 5 MG TAB: 5 mg | ORAL | Qty: 2

## 2017-07-02 MED FILL — PANTOPRAZOLE 40 MG TAB, DELAYED RELEASE: 40 mg | ORAL | Qty: 1

## 2017-07-02 MED FILL — HYDROXYZINE 25 MG TAB: 25 mg | ORAL | Qty: 2

## 2017-07-02 MED FILL — NICOTINE 21 MG/24 HR DAILY PATCH: 21 mg/24 hr | TRANSDERMAL | Qty: 1

## 2017-07-02 NOTE — Behavioral Health Treatment Team (Signed)
INITIAL PSYCHIATRIC EVALUATION          IDENTIFICATION:    Patient Name  Joan Molina   Date of Birth 05/26/1980   CSN [id number]   Medical Record Number  [id number]      Age  37 y.o.   PCP None   Admit date:  07/01/2017    Room Number  403/47  @ [facility]   Date of Service  07/02/2017            HISTORY         REASON FOR HOSPITALIZATION:  CC: "I am in a hopeless situation".     HISTORY OF PRESENT ILLNESS:    Patient was not cooperative. Gave only scanty information.  Reports she has become hopeless because she is homeless.  Was recently in [place] and says she was discharged to a "crack house."  Feels "alienated and hopeless."  Talks about self in the 3rd person.  Appears to be paranoid.  Declines further evaluation.      ALLERGIES:   Allergies   Allergen Reactions   ??? Penicillins Hives and Itching      MEDICATIONS PRIOR TO ADMISSION:   Medications Prior to Admission   Medication Sig   ??? ziprasidone (GEODON) 80 mg capsule Take 160 mg by mouth nightly.   ??? benztropine (COGENTIN) 2 mg tablet Take 1 mg by mouth two (2) times a day.   ??? OXcarbaxepine (TRILEPTAL) 600 mg tablet Take 600 mg by mouth two (2) times a day.   ??? lisinopril (PRINIVIL, ZESTRIL) 10 mg tablet Take 10 mg by mouth daily.   ??? esomeprazole (NEXIUM) 40 mg capsule Take 40 mg by mouth daily.   ??? acetaminophen (TYLENOL) 325 mg tablet Take 2 Tabs by mouth every four (4) hours as needed for Pain.   ??? naproxen (NAPROSYN) 375 mg tablet Take 1 Tab by mouth two (2) times daily (with meals).      PAST MEDICAL HISTORY:   Past Medical History:   Diagnosis Date   ??? Congestive heart failure (CHF) ([place]) 2011   ??? Hypertension    ??? Psychiatric disorder     Bipolar   ??? Stroke [facility])    History reviewed. No pertinent surgical history.   SOCIAL HISTORY:    Social History     Socioeconomic History   ??? Marital status: SINGLE     Spouse name: Not on file   ??? Number of children: Not on file   ??? Years of education: Not on file    ??? Highest education level: Not on file   Occupational History   ??? Not on file   Social Needs   ??? Financial resource strain: Not on file   ??? Food insecurity:     Worry: Not on file     Inability: Not on file   ??? Transportation needs:     Medical: Not on file     Non-medical: Not on file   Tobacco Use   ??? Smoking status: Current Every Day Smoker     Packs/day: 1.00     Years: 14.00     Pack years: 14.00   ??? Smokeless tobacco: Never Used   Substance and Sexual Activity   ??? Alcohol use: No   ??? Drug use: Yes     Types: Marijuana   ??? Sexual activity: Not on file   Lifestyle   ??? Physical activity:     Days per week: Not on file  Minutes per session: Not on file   ??? Stress: Not on file   Relationships   ??? Social connections:     Talks on phone: Not on file     Gets together: Not on file     Attends religious service: Not on file     Active member of club or organization: Not on file     Attends meetings of clubs or organizations: Not on file     Relationship status: Not on file   ??? Intimate partner violence:     Fear of current or ex partner: Not on file     Emotionally abused: Not on file     Physically abused: Not on file     Forced sexual activity: Not on file   Other Topics Concern   ??? Not on file   Social History Narrative   ??? Not on file      FAMILY HISTORY: History reviewed. No pertinent family history.   History reviewed. No pertinent family history.    REVIEW OF SYSTEMS:   Pertinent items are noted in the History of Present Illness.  All other Systems reviewed and are considered negative.           MENTAL STATUS EXAM & VITALS     MENTAL STATUS EXAM (MSE):    MSE FINDINGS ARE WITHIN NORMAL LIMITS (WNL) UNLESS OTHERWISE STATED BELOW. ( ALL OF THE BELOW CATEGORIES OF THE MSE HAVE BEEN REVIEWED (reviewed 07/02/2017) AND UPDATED AS DEEMED APPROPRIATE )                              General Presentation: Uncooperative  Language No aphasia or dysarthria  Speech:  Rapid, pressured and loud  Thought Processes logical   Thought Content: Paranoid  Perceptual disturbance: Denies  Suicidal Ideations Refuses to discuss  Homicidal Ideations Refuses to discuss  Mood:  "Depressed"  Affect:  Irritable  Concentration/Attention:  Impaired  Fund of Knowledge average  Insight:  Impaired  Judgment:  Impaired                                                               VITALS:     Patient Vitals for the past 24 hrs:   Pulse Resp BP SpO2   07/01/17 2016 98 20 (!) 152/97 100 %     Wt Readings from Last 3 Encounters:   07/01/17 107.5 kg (237 lb)   12/12/14 108.9 kg (240 lb)     Temp Readings from Last 3 Encounters:   12/12/14 98.7 ??F (37.1 ??C)     BP Readings from Last 3 Encounters:   07/01/17 (!) 152/97   12/12/14 (!) 138/97     Pulse Readings from Last 3 Encounters:   07/01/17 98   12/12/14 79            DATA     LABORATORY DATA:  Labs Reviewed   CBC WITH AUTOMATED DIFF - Abnormal; Notable for the following components:       Result Value    RDW 16.4 (*)     All other components within normal limits   METABOLIC PANEL, COMPREHENSIVE - Abnormal; Notable for the following components:    BUN/Creatinine ratio 9 (*)  A-G Ratio 1.0 (*)     All other components within normal limits   URINALYSIS W/ REFLEX CULTURE - Abnormal; Notable for the following components:    Protein TRACE (*)     Blood LARGE (*)     Mucus 3+ (*)     All other components within normal limits   DRUG SCREEN, URINE - Abnormal; Notable for the following components:    THC (TH-CANNABINOL) POSITIVE (*)     All other components within normal limits   ETHYL ALCOHOL   HCG URINE, QL. - POC     Admission on 07/01/2017   Component Date Value Ref Range Status   ??? WBC 07/01/2017 6.7  3.6 - 11.0 K/uL Final   ??? RBC 07/01/2017 4.29  3.80 - 5.20 M/uL Final   ??? HGB 07/01/2017 11.9  11.5 - 16.0 g/dL Final   ??? HCT 07/01/2017 37.6  35.0 - 47.0 % Final   ??? MCV 07/01/2017 87.6  80.0 - 99.0 FL Final   ??? MCH 07/01/2017 27.7  26.0 - 34.0 PG Final   ??? MCHC 07/01/2017 31.6  30.0 - 36.5 g/dL Final    ??? RDW 07/01/2017 16.4* 11.5 - 14.5 % Final   ??? PLATELET 07/01/2017 300  150 - 400 K/uL Final   ??? MPV 07/01/2017 8.9  8.9 - 12.9 FL Final   ??? NRBC 07/01/2017 0.0  0 PER 100 WBC Final   ??? ABSOLUTE NRBC 07/01/2017 0.00  0.00 - 0.01 K/uL Final   ??? NEUTROPHILS 07/01/2017 39  32 - 75 % Final   ??? LYMPHOCYTES 07/01/2017 47  12 - 49 % Final   ??? MONOCYTES 07/01/2017 8  5 - 13 % Final   ??? EOSINOPHILS 07/01/2017 6  0 - 7 % Final   ??? BASOPHILS 07/01/2017 0  0 - 1 % Final   ??? IMMATURE GRANULOCYTES 07/01/2017 0  0.0 - 0.5 % Final   ??? ABS. NEUTROPHILS 07/01/2017 2.6  1.8 - 8.0 K/UL Final   ??? ABS. LYMPHOCYTES 07/01/2017 3.2  0.8 - 3.5 K/UL Final   ??? ABS. MONOCYTES 07/01/2017 0.5  0.0 - 1.0 K/UL Final   ??? ABS. EOSINOPHILS 07/01/2017 0.4  0.0 - 0.4 K/UL Final   ??? ABS. BASOPHILS 07/01/2017 0.0  0.0 - 0.1 K/UL Final   ??? ABS. IMM. GRANS. 07/01/2017 0.0  0.00 - 0.04 K/UL Final   ??? DF 07/01/2017 AUTOMATED    Final   ??? Sodium 07/01/2017 139  136 - 145 mmol/L Final   ??? Potassium 07/01/2017 3.7  3.5 - 5.1 mmol/L Final   ??? Chloride 07/01/2017 105  97 - 108 mmol/L Final   ??? CO2 07/01/2017 25  21 - 32 mmol/L Final   ??? Anion gap 07/01/2017 9  5 - 15 mmol/L Final   ??? Glucose 07/01/2017 98  65 - 100 mg/dL Final   ??? BUN 07/01/2017 7  6 - 20 MG/DL Final   ??? Creatinine 07/01/2017 0.79  0.55 - 1.02 MG/DL Final   ??? BUN/Creatinine ratio 07/01/2017 9* 12 - 20   Final   ??? GFR est AA 07/01/2017 >60  >60 ml/min/1.61m Final   ??? GFR est non-AA 07/01/2017 >60  >60 ml/min/1.747mFinal   ??? Calcium 07/01/2017 8.7  8.5 - 10.1 MG/DL Final   ??? Bilirubin, total 07/01/2017 0.2  0.2 - 1.0 MG/DL Final   ??? ALT (SGPT) 07/01/2017 20  12 - 78 U/L Final   ??? AST (SGOT) 07/01/2017 21  15 -  37 U/L Final   ??? Alk. phosphatase 07/01/2017 69  45 - 117 U/L Final   ??? Protein, total 07/01/2017 7.6  6.4 - 8.2 g/dL Final   ??? Albumin 07/01/2017 3.8  3.5 - 5.0 g/dL Final   ??? Globulin 07/01/2017 3.8  2.0 - 4.0 g/dL Final   ??? A-G Ratio 07/01/2017 1.0* 1.1 - 2.2   Final    ??? Color 07/01/2017 YELLOW/STRAW    Final   ??? Appearance 07/01/2017 CLEAR  CLEAR   Final   ??? Specific gravity 07/01/2017 1.030  1.003 - 1.030   Final   ??? pH (UA) 07/01/2017 5.5  5.0 - 8.0   Final   ??? Protein 07/01/2017 TRACE* NEG mg/dL Final   ??? Glucose 07/01/2017 NEGATIVE   NEG mg/dL Final   ??? Ketone 07/01/2017 NEGATIVE   NEG mg/dL Final   ??? Bilirubin 07/01/2017 NEGATIVE   NEG   Final   ??? Blood 07/01/2017 LARGE* NEG   Final   ??? Urobilinogen 07/01/2017 1.0  0.2 - 1.0 EU/dL Final   ??? Nitrites 07/01/2017 NEGATIVE   NEG   Final   ??? Leukocyte Esterase 07/01/2017 NEGATIVE   NEG   Final   ??? WBC 07/01/2017 0-4  0 - 4 /hpf Final   ??? RBC 07/01/2017 5-10  0 - 5 /hpf Final   ??? Epithelial cells 07/01/2017 FEW  FEW /lpf Final   ??? Bacteria 07/01/2017 NEGATIVE   NEG /hpf Final   ??? UA:UC IF INDICATED 07/01/2017 CULTURE NOT INDICATED BY UA RESULT  CNI   Final   ??? Mucus 07/01/2017 3+* NEG /lpf Final   ??? AMPHETAMINES 07/01/2017 NEGATIVE   NEG   Final   ??? BARBITURATES 07/01/2017 NEGATIVE   NEG   Final   ??? BENZODIAZEPINES 07/01/2017 NEGATIVE   NEG   Final   ??? COCAINE 07/01/2017 NEGATIVE   NEG   Final   ??? METHADONE 07/01/2017 NEGATIVE   NEG   Final   ??? OPIATES 07/01/2017 NEGATIVE   NEG   Final   ??? PCP(PHENCYCLIDINE) 07/01/2017 NEGATIVE   NEG   Final   ??? THC (TH-CANNABINOL) 07/01/2017 POSITIVE* NEG   Final   ??? Drug screen comment 07/01/2017 (NOTE)   Final   ??? ALCOHOL(ETHYL),SERUM 07/01/2017 <10  <10 MG/DL Final   ??? Pregnancy test,urine (POC) 07/01/2017 NEGATIVE   NEG   Final        RADIOLOGY REPORTS:  Results from Hospital Encounter encounter on 12/12/14   XR ANKLE LT MIN 3 V    Narrative EXAM:  XR ANKLE LT MIN 3 V  Clinical history: Fall, foot pain  INDICATION:  fall pain.         COMPARISON: None.    FINDINGS: Three views of the left ankle demonstrate no fracture or disruption of  the ankle mortise.  There is no other acute osseous or articular abnormality.   The soft tissues are within normal limits.            Impression IMPRESSION: No acute abnormality.          No results found.           MEDICATIONS       ALL MEDICATIONS  Current Facility-Administered Medications   Medication Dose Route Frequency   ??? ziprasidone (GEODON) 20 mg in sterile water (preservative free) 1 mL injection  20 mg IntraMUSCular BID PRN   ??? OLANZapine (ZyPREXA) tablet 5 mg  5 mg Oral Q6H PRN   ???  benztropine (COGENTIN) tablet 2 mg  2 mg Oral BID PRN   ??? benztropine (COGENTIN) injection 2 mg  2 mg IntraMUSCular BID PRN   ??? LORazepam (ATIVAN) injection 2 mg  2 mg IntraMUSCular Q4H PRN   ??? LORazepam (ATIVAN) tablet 1 mg  1 mg Oral Q4H PRN   ??? acetaminophen (TYLENOL) tablet 650 mg  650 mg Oral Q4H PRN   ??? ibuprofen (MOTRIN) tablet 400 mg  400 mg Oral Q8H PRN   ??? magnesium hydroxide (MILK OF MAGNESIA) 400 mg/5 mL oral suspension 30 mL  30 mL Oral DAILY PRN   ??? nicotine (NICODERM CQ) 21 mg/24 hr patch 1 Patch  1 Patch TransDERmal DAILY PRN   ??? hydrOXYzine HCl (ATARAX) tablet 50 mg  50 mg Oral Q6H PRN   ??? traZODone (DESYREL) tablet 50 mg  50 mg Oral QHS PRN   ??? lisinopril (PRINIVIL, ZESTRIL) tablet 10 mg  10 mg Oral DAILY   ??? pantoprazole (PROTONIX) tablet 40 mg  40 mg Oral DAILY      SCHEDULED MEDICATIONS  Current Facility-Administered Medications   Medication Dose Route Frequency   ??? lisinopril (PRINIVIL, ZESTRIL) tablet 10 mg  10 mg Oral DAILY   ??? pantoprazole (PROTONIX) tablet 40 mg  40 mg Oral DAILY              ASSESSMENT & PLAN        The patient, Joan Molina, is a 37 y.o.  female who presents at this time for treatment of the following diagnoses:  Patient Active Hospital Problem List:   Bipolar disorder ([place]) (07/01/2017)    Assessment:   Bipolar disorder, manic    Plan: Commence Zyprexa '5mg'$  BID            I will continue to monitor blood levels (Depakote, Tegretol, lithium, clozapine---a drug with a narrow therapeutic index= NTI) and associated labs for drug therapy implemented that require intense monitoring for  toxicity as deemed appropriate based on current medication side effects and pharmacodynamically determined drug 1/2 lives.         A coordinated, multidisplinary treatment team (includes the nurse, unit pharmcist, Catering manager) round was conducted for this initial evaluation with the patient present.     The following regarding medications was addressed during rounds with patient:   the risks and benefits of the proposed medication. The patient was given the opportunity to ask questions. Informed consent given to the use of the above medications.     I will continue to adjust psychiatric and non-psychiatric medications (see above "medication" section and orders section for details) as deemed appropriate & based upon diagnoses and response to treatment.     I have reviewed admission (and previous/old) labs and medical tests in the EHR and or transferring hospital documents. I will continue to order blood tests/labs and diagnostic tests as deemed appropriate and review results as they become available (see orders for details).    I have reviewed old psychiatric and medical records available in the EHR. I Will order additional psychiatric records from other institutions to further elucidate the nature of patient's psychopathology and review once available.    I will gather additional collateral information from friends, family and o/p treatment team to further elucidate the nature of patient's psychopathology and baselline level of psychiatric functioning.      ESTIMATED LENGTH OF STAY:    5 to 7 days       STRENGTHS:  Knowledge of medications  SIGNED:    Merry Lofty, MD  07/02/2017

## 2017-07-02 NOTE — Behavioral Health Treatment Team (Signed)
Group note.    50 minute group consisted of a discussion of the benefits of group activities.    Pt did not attend this group despite being prompted by staff.

## 2017-07-02 NOTE — ED Notes (Signed)
TRANSFER - OUT REPORT:    Verbal report given to Shari HeritageSarah Kopelove RN(name) on Joan Molina  being transferred to 3rd floor psych (unit) for routine progression of care       Report consisted of patient???s Situation, Background, Assessment and   Recommendations(SBAR).     Information from the following report(s) SBAR was reviewed with the receiving nurse.    Lines:       Opportunity for questions and clarification was provided.      Patient transported with:   personal property, luggage. Security taking pt to 3rd floor.

## 2017-07-02 NOTE — Progress Notes (Signed)
Came out of room at lunch time yelling about wanting her flip flops. She was loud, aggressive anything said to her angered her further. Foul speaking especially to Clinical research associatewriter who is her nurse. Not threatening just very aggressive and offensive with her speech and unable to be redirected. Gave her Nicotine patch as she requested along with Vistaril she requested but she declined to take her Zestril and her protonix saying she doesn't take that. She continued to yell for about 8 min then went into her room and quieted on her own.

## 2017-07-02 NOTE — Behavioral Health Treatment Team (Signed)
Patient came onto floor agitated and verbally aggressive to all staff.  Patient refused initial vitals and skin check.  Interview was done. Not able to complete admission packet and get patients signature due to agitation. Patient endorsed SI with no plan, denies HI/AVH.  Patient offered food and drink, refused.  After admittance interview, pt immediately went to bed, no medications given.    Will monitor for safety.

## 2017-07-02 NOTE — Behavioral Health Treatment Team (Signed)
0215: Pt arrived the unit, became uncooperative, aggressive,agitated and verbally threatening toward staff. Pt refused admission assessment and refused to cooperate with multiple staff members. Pt directed to reduce her yelling, screaming and loud verbal outbursts which woke a large number of other pt from sleep but pt refused.  Code [employer] activated at this time.     0225: Pt remains agitated and hyper-verbal at this time. Pt observed in her room at this time with her primary nurse at bedside. Pt's primary nurse continues to engage pt as well as other staff. Pt offered reassurance and support.  Will continue to monitor pt for health and safety

## 2017-07-02 NOTE — Progress Notes (Signed)
Did speak with psychiatrist when he made his rounds, resistant at first and asked the accompanying nurse to leave the room but she didn't press it and nurse stayed. She did give him some information but when she said what she wanted to and wouldn't go any further.

## 2017-07-02 NOTE — Progress Notes (Addendum)
Patient lying in bed in no distress. Patient was dismissive and guarded in her responses. She denies thoughts of harm to herself or others. Patient denies hallucinations. Patient denies pain. Patient was calm and quiet at this time. No complaints voiced. Continue to monitor.  Patient slept 7 hrs.

## 2017-07-02 NOTE — ED Notes (Signed)
Pt taken to 3rd floor by security. All property sent with pt. Pt is alert and oriented right now.

## 2017-07-02 NOTE — Behavioral Health Treatment Team (Addendum)
Assumed care of pt at 0300.  No distress observed or reported over night.    Pt slept  2.75 hours

## 2017-07-02 NOTE — H&P (Signed)
Hospitalist Admission NoteNAME: Joan Molina   DOB:  09-15-1980   MRN:  [id number]   Room Number: 313/02  @ [facility]     Date/Time:  07/02/2017 12:11 PM    Patient PCP: None  ______________________________________________________________________  Given the patient's current clinical presentation, I have a high level of concern for decompensation if discharged from the emergency department.  Complex decision making was performed, which includes reviewing the patient's available past medical records, laboratory results, and x-ray films.       My assessment of this patient's clinical condition and my plan of care is as follows.    Assessment / Plan:    Hypertension  GERD  Marijuana abuse  Tobacco dependence  ?  History of stroke  ?  History of congestive heart failure      Reviewed prior to arrival home meds.  Patient is only on lisinopril.  I do not see any beta-blocker on her PTA med list.  She is also not on his statin or aspirin.  When asked about the diagnosis patient got very upset and told me to leave the room.  She has been declining her lisinopril.  Have reconcilled lisinopril and Protonix.  Have requested the nurse to offer any ways.     Psychiatric treatment and management of health issues,Defer to psychiatrist for further management.  2.  Continue home meds.  3.  Medically stable at this time.  We will follow up on a p.r.n. basis.  4.  No VTE prophylaxis indicated or warranted at this time.        Subjective:   CHIEF COMPLAINT: depression,SI    HISTORY OF PRESENT ILLNESS:     Joan Molina is a 36 y.o.female with PMH of  HTN,Stroke,bipolar  Who is admitted to [facility].  Per ED physician-Joan Molina is a 37 y.o. female with a PMH of bipolar HTN Stroke who presents with suicidal thoughts past few days.Patient has no plan.. Patient states she just feels hopeless. Patient states she was evaluated at [facility] and  Was sent to a community home which she calls a  crack house .She states she did not want to stay there.  Patient states she has recently moved here from [place].and is   not taking any of her psychotropic medications.  She also reports she has a history of seizure disorder which worsened after her stroke.  States she has been on Trileptal and other medications but she no longer takes them.  Patient denies homicidal ideation or hallucinations.Because there are no symptoms or complaints of pain, there is no reported quality, severity, modifying factors, or associated signs and symptoms reported.Because there are no symptoms or complaints of pain, there is no reported quality, severity, modifying factors, or associated signs and symptoms reported.     Very limited history secondary to patient  being uncooperative, aggressive,agitated and verbally threatening toward staff. Pt refused admission assessment and refused to cooperate with multiple staff members. Pt directed to reduce her yelling, screaming and loud verbal outbursts .    She denies any current medical concerns at this time and requesting for nicotine patch.Declines lisinopril and PPI.        Past Medical History:   Diagnosis Date   ??? Congestive heart failure (CHF) ([place]) 2011   ??? Hypertension    ??? Psychiatric disorder     Bipolar   ??? Stroke [facility])         History reviewed. No pertinent surgical history.    Social  History     Tobacco Use   ??? Smoking status: Current Every Day Smoker     Packs/day: 1.00     Years: 14.00     Pack years: 14.00   ??? Smokeless tobacco: Never Used   Substance Use Topics   ??? Alcohol use: No        History reviewed. No pertinent family history.  Allergies   Allergen Reactions   ??? Penicillins Hives and Itching        Prior to Admission medications    Medication Sig Start Date End Date Taking? Authorizing Provider   ziprasidone (GEODON) 80 mg capsule Take 160 mg by mouth nightly.    Other, Phys, MD   benztropine (COGENTIN) 2 mg tablet Take 1 mg by mouth two (2) times a day.     Other, Phys, MD   OXcarbaxepine (TRILEPTAL) 600 mg tablet Take 600 mg by mouth two (2) times a day.    Other, Phys, MD   lisinopril (PRINIVIL, ZESTRIL) 10 mg tablet Take 10 mg by mouth daily.    Other, Phys, MD   esomeprazole (NEXIUM) 40 mg capsule Take 40 mg by mouth daily.    Other, Phys, MD   acetaminophen (TYLENOL) 325 mg tablet Take 2 Tabs by mouth every four (4) hours as needed for Pain. 12/12/14   Jerilynn Birkenhead, MD   naproxen (NAPROSYN) 375 mg tablet Take 1 Tab by mouth two (2) times daily (with meals). 12/12/14   Jerilynn Birkenhead, MD       REVIEW OF SYSTEMS:     I am not able to complete the review of systems because:   The patient is intubated and sedated    The patient has altered mental status due to his acute medical problems    The patient has baseline aphasia from prior stroke(s)    The patient has baseline dementia and is not reliable historian    The patient is in acute medical distress and unable to provide information           Total of 12 systems reviewed as follows:       POSITIVE= underlined text  Negative = text not underlined  General:  fever, chills, sweats, generalized weakness, weight loss/gain,      loss of appetite   Eyes:    blurred vision, eye pain, loss of vision, double vision  ENT:    rhinorrhea, pharyngitis   Respiratory:   cough, sputum production, SOB, DOE, wheezing, pleuritic pain   Cardiology:   chest pain, palpitations, orthopnea, PND, edema, syncope   Gastrointestinal:  abdominal pain , N/V, diarrhea, dysphagia, constipation, bleeding   Genitourinary:  frequency, urgency, dysuria, hematuria, incontinence   Muskuloskeletal :  arthralgia, myalgia, back pain  Hematology:  easy bruising, nose or gum bleeding, lymphadenopathy   Dermatological: rash, ulceration, pruritis, color change / jaundice  Endocrine:   hot flashes or polydipsia   Neurological:  headache, dizziness, confusion, focal weakness, paresthesia,     Speech difficulties, memory loss, gait difficulty   Psychological: Feelings of anxiety, depression, agitation    Objective:   VITALS:    Visit Vitals  BP (!) 152/97 (BP 1 Location: Right arm, BP Patient Position: Sitting)   Pulse 98   Resp 20   Ht '5\' 9"'  (1.753 m)   Wt 107.5 kg (237 lb)   SpO2 100%   BMI 35.00 kg/m??       PHYSICAL EXAM:    General:    Alert,  cooperative, no distress, appears stated age.     HEENT: Atraumatic, anicteric sclerae, pink conjunctivae     No oral ulcers, mucosa moist, throat clear, dentition fair  Neck:  Supple, symmetrical,  thyroid: non tender  Lungs:   Clear to auscultation bilaterally.  No Wheezing or Rhonchi. No rales.  Chest wall:  No tenderness  No Accessory muscle use.  Heart:   Regular  rhythm,  No  murmur   No edema  Abdomen:   Soft, non-tender. Not distended.  Bowel sounds normal  Extremities: No cyanosis.  No clubbing,      Skin turgor normal, Capillary refill normal, Radial dial pulse 2+  Skin:     Not pale.  Not Jaundiced  No rashes   Psych:  Poor insight.   depressed.  anxious and agitated.+  Neurologic: EOMs intact. No facial asymmetry. No aphasia or slurred speech. Symmetrical strength, Sensation grossly intact. Alert and oriented X 4.     ______________________________________________________________________    Care Plan discussed with:  Patient/Family and Nurse  ________________________________________________________________________  TOTAL TIME:  40 Minutes    Critical Care Provided     Minutes non procedure based      Comments     Reviewed previous records   >50% of visit spent in counseling and coordination of care  Discussion with patient and/or family and questions answered       ________________________________________________________________________  Signed: Ree Kida, MD    Procedures: see electronic medical records for all procedures/Xrays and details which were not copied into this note but were reviewed prior to creation of Plan.    LAB DATA REVIEWED:    Recent Results (from the past 24 hour(s))    CBC WITH AUTOMATED DIFF    Collection Time: 07/01/17  8:30 PM   Result Value Ref Range    WBC 6.7 3.6 - 11.0 K/uL    RBC 4.29 3.80 - 5.20 M/uL    HGB 11.9 11.5 - 16.0 g/dL    HCT 37.6 35.0 - 47.0 %    MCV 87.6 80.0 - 99.0 FL    MCH 27.7 26.0 - 34.0 PG    MCHC 31.6 30.0 - 36.5 g/dL    RDW 16.4 (H) 11.5 - 14.5 %    PLATELET 300 150 - 400 K/uL    MPV 8.9 8.9 - 12.9 FL    NRBC 0.0 0 PER 100 WBC    ABSOLUTE NRBC 0.00 0.00 - 0.01 K/uL    NEUTROPHILS 39 32 - 75 %    LYMPHOCYTES 47 12 - 49 %    MONOCYTES 8 5 - 13 %    EOSINOPHILS 6 0 - 7 %    BASOPHILS 0 0 - 1 %    IMMATURE GRANULOCYTES 0 0.0 - 0.5 %    ABS. NEUTROPHILS 2.6 1.8 - 8.0 K/UL    ABS. LYMPHOCYTES 3.2 0.8 - 3.5 K/UL    ABS. MONOCYTES 0.5 0.0 - 1.0 K/UL    ABS. EOSINOPHILS 0.4 0.0 - 0.4 K/UL    ABS. BASOPHILS 0.0 0.0 - 0.1 K/UL    ABS. IMM. GRANS. 0.0 0.00 - 0.04 K/UL    DF AUTOMATED     METABOLIC PANEL, COMPREHENSIVE    Collection Time: 07/01/17  8:30 PM   Result Value Ref Range    Sodium 139 136 - 145 mmol/L    Potassium 3.7 3.5 - 5.1 mmol/L    Chloride 105 97 - 108 mmol/L    CO2 25 21 -  32 mmol/L    Anion gap 9 5 - 15 mmol/L    Glucose 98 65 - 100 mg/dL    BUN 7 6 - 20 MG/DL    Creatinine 0.79 0.55 - 1.02 MG/DL    BUN/Creatinine ratio 9 (L) 12 - 20      GFR est AA >60 >60 ml/min/1.70m    GFR est non-AA >60 >60 ml/min/1.795m   Calcium 8.7 8.5 - 10.1 MG/DL    Bilirubin, total 0.2 0.2 - 1.0 MG/DL    ALT (SGPT) 20 12 - 78 U/L    AST (SGOT) 21 15 - 37 U/L    Alk. phosphatase 69 45 - 117 U/L    Protein, total 7.6 6.4 - 8.2 g/dL    Albumin 3.8 3.5 - 5.0 g/dL    Globulin 3.8 2.0 - 4.0 g/dL    A-G Ratio 1.0 (L) 1.1 - 2.2     URINALYSIS W/ REFLEX CULTURE    Collection Time: 07/01/17  8:30 PM   Result Value Ref Range    Color YELLOW/STRAW      Appearance CLEAR CLEAR      Specific gravity 1.030 1.003 - 1.030      pH (UA) 5.5 5.0 - 8.0      Protein TRACE (A) NEG mg/dL    Glucose NEGATIVE  NEG mg/dL    Ketone NEGATIVE  NEG mg/dL    Bilirubin NEGATIVE  NEG       Blood LARGE (A) NEG      Urobilinogen 1.0 0.2 - 1.0 EU/dL    Nitrites NEGATIVE  NEG      Leukocyte Esterase NEGATIVE  NEG      WBC 0-4 0 - 4 /hpf    RBC 5-10 0 - 5 /hpf    Epithelial cells FEW FEW /lpf    Bacteria NEGATIVE  NEG /hpf    UA:UC IF INDICATED CULTURE NOT INDICATED BY UA RESULT CNI      Mucus 3+ (A) NEG /lpf   DRUG SCREEN, URINE    Collection Time: 07/01/17  8:30 PM   Result Value Ref Range    AMPHETAMINES NEGATIVE  NEG      BARBITURATES NEGATIVE  NEG      BENZODIAZEPINES NEGATIVE  NEG      COCAINE NEGATIVE  NEG      METHADONE NEGATIVE  NEG      OPIATES NEGATIVE  NEG      PCP(PHENCYCLIDINE) NEGATIVE  NEG      THC (TH-CANNABINOL) POSITIVE (A) NEG      Drug screen comment (NOTE)    ETHYL ALCOHOL    Collection Time: 07/01/17  8:30 PM   Result Value Ref Range    ALCOHOL(ETHYL),SERUM <10 <10 MG/DL   HCG URINE, QL. - POC    Collection Time: 07/01/17 10:08 PM   Result Value Ref Range    Pregnancy test,urine (POC) NEGATIVE  NEG

## 2017-07-02 NOTE — Behavioral Health Treatment Team (Signed)
Social Work note:      The pt refused to complete assessment.

## 2017-07-02 NOTE — Behavioral Health Treatment Team (Signed)
Group note.    [employer]ai chi breathing and basic practice.    Pt did not attend.

## 2017-07-03 MED ORDER — OLANZAPINE 5 MG TAB
5 mg | ORAL | Status: DC | PRN
Start: 2017-07-03 — End: 2017-07-04

## 2017-07-03 MED ORDER — ZIPRASIDONE MESYLATE 20 MG IM SOLR
20 mg/mL (final conc.) | Freq: Four times a day (QID) | INTRAMUSCULAR | Status: DC | PRN
Start: 2017-07-03 — End: 2017-07-05

## 2017-07-03 MED FILL — PANTOPRAZOLE 40 MG TAB, DELAYED RELEASE: 40 mg | ORAL | Qty: 1

## 2017-07-03 MED FILL — HYDROXYZINE 25 MG TAB: 25 mg | ORAL | Qty: 2

## 2017-07-03 MED FILL — GEODON 20 MG/ML (FINAL CONCENTRATION) INTRAMUSCULAR SOLUTION: 20 mg/mL (final conc.) | INTRAMUSCULAR | Qty: 20

## 2017-07-03 MED FILL — OLANZAPINE 5 MG TAB: 5 mg | ORAL | Qty: 1

## 2017-07-03 MED FILL — LISINOPRIL 5 MG TAB: 5 mg | ORAL | Qty: 2

## 2017-07-03 MED FILL — NICOTINE 21 MG/24 HR DAILY PATCH: 21 mg/24 hr | TRANSDERMAL | Qty: 1

## 2017-07-03 NOTE — Progress Notes (Addendum)
0710 Karsten RoLaWanda Hubbard RN received report from [employer] CorporationPatricia McNeil RN.    Pt presents with a hostile attitude, angry affect, and aggressive mood. Pt denied  SI, HI, and AVH. Pt meal compliant. Pt not med compliant.     0830 Pt became upset when she did not receive her medications when asked. [facility]. Pt became angry then questioned Bethann Berkshirerisha about the color of one of her medications. Pt threw the medications at [place]. Pt walked away stating "next time will be worst." Pt went to her room screaming curse words then proceeded to slam the door. Nursing supervisor Carollee Herteriffany Jackson contacted. This Clinical research associatewriter contacted the [employer]. Dr Dellie CatholicSommers ordered IM Geodon and po Zyprexa for the pt. When the pt was presented with the medication, pt stated "I am calm nowl" Pt will continue to be monitored for health and safety.     1544 Pt remains isolative to room. Pt refused to speak to the on call psychiatrist. Pt remains meal compliant. Pt presents with an angry mood. Pt refused to speak to this Clinical research associatewriter. Unable to educate pt.     1738 Pt heard laughing loudly in room by herself.

## 2017-07-03 NOTE — Behavioral Health Treatment Team (Signed)
Group note.    Creative expressions.  Pts created collages according to their own interests    Pt did not attend.

## 2017-07-03 NOTE — Progress Notes (Addendum)
Went to talk to pt in her room but pt refused to talk with this nurse.  Pt states "I don't want to be bothered."  Pt is loud, angry, and cursing.  Pt is withdrawn and guarded.  Pt is also isolating to room.  When this nurse left pt room, pt continued to talk to self/unseen person.  Will continue to monitor for safety.    2100:  Pt refused night meds.      07/04/2017 @ 0545:  Pt is resting quietly in bed.  No signs/symptoms of distress noted.  Pt has rested throughout the night.  Pt has slept 7.5 hours tonight.  Will continue to monitor.

## 2017-07-03 NOTE — Behavioral Health Treatment Team (Signed)
Nursing Supervisor called to the unit, the patient is in her room yelling/cursing at self. Patients nurse reports that the patient is refusing her medication today, reports per staff patient has not been taking her medication from any of the staff. She was asked to call the patients psychiatrists & provide information & ask what he would like to do at this time. The patient is voluntary at this time. The patient is in her room at this time yelling with door closed. The patients yelling/cursing is causing another patient at the end of the hall to become agitated, security was called to come to the unit at this time.      Patient nurse called psychiatrists, he was updated on the patients behavior. Nurse reported that the patient was refusing her medication, threw her medication at nurse & was threaten to staff. This was not witnessed by Nursing Supervisor. Order for Zyprexa & Geodon IM order.     Nurse went to medicate the patient, the patient is lying in bed with lights off at this time. She is quiet, the patient was offered medication & it was explained to her why it was being giving. The patient is reporting that she has calmed herself down at this time & would like to be left alone. Medications will be held at this time, nurse will continue to monitor the patient & situation.

## 2017-07-03 NOTE — Behavioral Health Treatment Team (Signed)
Chief Complaint:  "WHAT KIND OF PLACE IS THIS?"    Interval History:  Patient remains irritable and angry. Yells without provocation and has been disruptive to the milieu.   Declines to speak with this Probation officer.  Per staff, patient is refusing her medications. Threw meds at staff today.      Mental Status Exam:  Appearance: Appropriate  Behavior: Uncooperative; fixed stare at times  Speech: Mostly mute. Loud when she speaks  Mood: Refuses to discuss  Affect: Blunt  Thought process: Poverty  Thought content: Paranoid  Perceptual disturbances: Appears to be responding  Insight: Impaired  Judgment: Impaired    Assessment and Plan:  Bipolar disorder,manic    Continue the current medication regimen   Disposition planning to continue.       Current Facility-Administered Medications   Medication Dose Route Frequency   ??? lisinopril (PRINIVIL, ZESTRIL) tablet 10 mg  10 mg Oral DAILY   ??? pantoprazole (PROTONIX) tablet 40 mg  40 mg Oral DAILY   ??? OLANZapine (ZyPREXA) tablet 5 mg  5 mg Oral BID       Physical Exam:         Past Medical History:  Past Medical History:   Diagnosis Date   ??? Congestive heart failure (CHF) ([place]) 2011   ??? Hypertension    ??? Psychiatric disorder     Bipolar   ??? Stroke [facility])            Labs:  Lab Results   Component Value Date/Time    WBC 6.7 07/01/2017 08:30 PM    HGB 11.9 07/01/2017 08:30 PM    HCT 37.6 07/01/2017 08:30 PM    PLATELET 300 07/01/2017 08:30 PM    MCV 87.6 07/01/2017 08:30 PM      Lab Results   Component Value Date/Time    Sodium 139 07/01/2017 08:30 PM    Potassium 3.7 07/01/2017 08:30 PM    Chloride 105 07/01/2017 08:30 PM    CO2 25 07/01/2017 08:30 PM    Anion gap 9 07/01/2017 08:30 PM    Glucose 98 07/01/2017 08:30 PM    BUN 7 07/01/2017 08:30 PM    Creatinine 0.79 07/01/2017 08:30 PM    BUN/Creatinine ratio 9 (L) 07/01/2017 08:30 PM    GFR est AA >60 07/01/2017 08:30 PM    GFR est non-AA >60 07/01/2017 08:30 PM    Calcium 8.7 07/01/2017 08:30 PM     Bilirubin, total 0.2 07/01/2017 08:30 PM    AST (SGOT) 21 07/01/2017 08:30 PM    Alk. phosphatase 69 07/01/2017 08:30 PM    Protein, total 7.6 07/01/2017 08:30 PM    Albumin 3.8 07/01/2017 08:30 PM    Globulin 3.8 07/01/2017 08:30 PM    A-G Ratio 1.0 (L) 07/01/2017 08:30 PM    ALT (SGPT) 20 07/01/2017 08:30 PM      Vitals:    07/01/17 2016 07/02/17 1930   BP: (!) 152/97 126/79   Pulse: 98 83   Resp: 20 16   Temp:  98.1 ??F (36.7 ??C)   SpO2: 100% 100%   Weight: 107.5 kg (237 lb)    Height: '5\' 9"'  (1.753 m)

## 2017-07-04 MED ORDER — PALIPERIDONE SR 3 MG 24 HR TAB
3 mg | Freq: Every day | ORAL | Status: DC
Start: 2017-07-04 — End: 2017-07-06

## 2017-07-04 MED ORDER — DIVALPROEX 500 MG TAB, DELAYED RELEASE
500 mg | Freq: Two times a day (BID) | ORAL | Status: DC
Start: 2017-07-04 — End: 2017-07-05

## 2017-07-04 MED ORDER — MELATONIN 3 MG TAB
3 mg | Freq: Every evening | ORAL | Status: DC
Start: 2017-07-04 — End: 2017-07-07
  Administered 2017-07-05: 01:00:00 via ORAL

## 2017-07-04 MED ORDER — THERAPEUTIC MULTIVITAMIN TAB
Freq: Every day | ORAL | Status: DC
Start: 2017-07-04 — End: 2017-07-04

## 2017-07-04 MED ORDER — B COMPLEX VITAMINS CAP
Freq: Every day | ORAL | Status: DC
Start: 2017-07-04 — End: 2017-07-11
  Administered 2017-07-04 – 2017-07-11 (×7): via ORAL

## 2017-07-04 MED ORDER — OLANZAPINE 5 MG TAB
5 mg | ORAL | Status: DC | PRN
Start: 2017-07-04 — End: 2017-07-11

## 2017-07-04 MED FILL — VITAMINS B COMPLEX CAPSULE: ORAL | Qty: 1

## 2017-07-04 MED FILL — HYDROXYZINE 25 MG TAB: 25 mg | ORAL | Qty: 2

## 2017-07-04 MED FILL — NICOTINE 21 MG/24 HR DAILY PATCH: 21 mg/24 hr | TRANSDERMAL | Qty: 1

## 2017-07-04 NOTE — Progress Notes (Signed)
Laboratory monitoring for mood stabilizer and antipsychotics:    Recommended baseline monitoring has not been completed based on this patient's current medication regimen.The following labs have been ordered to complete baseline monitoring: TSH, lipid panel, hemoglobin A1c    The patient is currently taking the following medication(s):   Current Facility-Administered Medications   Medication Dose Route Frequency    divalproex DR (DEPAKOTE) tablet 500 mg  500 mg Oral Q12H    paliperidone (INVEGA) SR tablet 6 mg  6 mg Oral DAILY    melatonin tablet 6 mg  6 mg Oral QHS    vitamin B complex capsule 1 Cap  1 Cap Oral DAILY    lisinopril (PRINIVIL, ZESTRIL) tablet 10 mg  10 mg Oral DAILY    pantoprazole (PROTONIX) tablet 40 mg  40 mg Oral DAILY   Height, Weight, BMI Estimation  Estimated body mass index is 35 kg/m?? as calculated from the following:    Height as of this encounter: 175.3 cm (69").    Weight as of this encounter: 107.5 kg (237 lb).     Renal Function, Hepatic Function and Chemistry  Estimated Creatinine Clearance: 126.1 mL/min (based on SCr of 0.79 mg/dL).    Lab Results   Component Value Date/Time    Sodium 139 07/01/2017 08:30 PM    Potassium 3.7 07/01/2017 08:30 PM    Chloride 105 07/01/2017 08:30 PM    CO2 25 07/01/2017 08:30 PM    Anion gap 9 07/01/2017 08:30 PM    Glucose 98 07/01/2017 08:30 PM    BUN 7 07/01/2017 08:30 PM    Creatinine 0.79 07/01/2017 08:30 PM    BUN/Creatinine ratio 9 (L) 07/01/2017 08:30 PM    GFR est AA >60 07/01/2017 08:30 PM    GFR est non-AA >60 07/01/2017 08:30 PM    Calcium 8.7 07/01/2017 08:30 PM    ALT (SGPT) 20 07/01/2017 08:30 PM    AST (SGOT) 21 07/01/2017 08:30 PM    Alk. phosphatase 69 07/01/2017 08:30 PM    Protein, total 7.6 07/01/2017 08:30 PM    Albumin 3.8 07/01/2017 08:30 PM    Globulin 3.8 07/01/2017 08:30 PM    A-G Ratio 1.0 (L) 07/01/2017 08:30 PM    Bilirubin, total 0.2 07/01/2017 08:30 PM       Lab Results   Component Value Date/Time     Glucose 98 07/01/2017 08:30 PM       Hematology  Lab Results   Component Value Date/Time    WBC 6.7 07/01/2017 08:30 PM    HGB 11.9 07/01/2017 08:30 PM    HCT 37.6 07/01/2017 08:30 PM    PLATELET 300 07/01/2017 08:30 PM    MCV 87.6 07/01/2017 08:30 PM     Vitals  Visit Vitals  BP 125/89 (BP 1 Location: Right arm, BP Patient Position: At rest)   Pulse 95   Temp 98.5 ??F (36.9 ??C)   Resp 18   Ht 175.3 cm (69")   Wt 107.5 kg (237 lb)   SpO2 100%   BMI 35.00 kg/m??     Pregnancy Test  Lab Results   Component Value Date/Time    Pregnancy test,urine (POC) NEGATIVE  07/01/2017 10:08 PM       Louis Matte, PharmD, BCPS  [phone] (pharmacy)

## 2017-07-04 NOTE — Behavioral Health Treatment Team (Signed)
Social work process group    Patient did not attend social work process group.

## 2017-07-04 NOTE — Behavioral Health Treatment Team (Cosign Needed)
Pt did not attend coping skills group.

## 2017-07-04 NOTE — Progress Notes (Addendum)
Patient very labile and irritated. Patient was yelling at staff and being verbally aggressive. Patient did refuse her Depakote. She did take the Melatonin. Patient was offered some prn medication for anxiety. Patient declined. Patient did not require any restraint. She was able to calm herself down. Patient did not endorse thoughts of harm to herself or others. Continue to monitor.  Patient slept 6 hrs.

## 2017-07-04 NOTE — Behavioral Health Treatment Team (Cosign Needed)
GROUP THERAPY PROGRESS NOTE    The patient Joan Molina a 37 y.o. female is participating in Creative Expression Group.     Group time: 1 hour    Personal goal for participation: To concentrate on selected task    Goal orientation: social    Group therapy participation: active    Therapeutic interventions reviewed and discussed: Crafts, games, music    Impression of participation: The patient was attentive.    Audelia HivesBeverly S Baker  07/04/2017  6:07 PM

## 2017-07-04 NOTE — Behavioral Health Treatment Team (Signed)
PSYCHIATRIC PROGRESS NOTE       Patient Name  Joan Molina   Date of Birth 31-May-1980   CSN [id number]   Medical Record Number  [id number]      Age  37 y.o.   PCP None   Admit date:  07/01/2017    Room Number  313/02  @ [facility]   Date of Service  07/04/2017         E & M PROGRESS NOTE:       HISTORY       CC:  "I am in a hopeless situation"HISTORY OF PRESENT ILLNESS/INTERVAL HISTORY:  (reviewed/updated 07/04/2017).  per initial evaluation: Patient was not cooperative. Gave only scanty information. Reports she has become hopeless because she is homeless. Was recently in [place] and says she was discharged to a "crack house."  Feels "alienated and hopeless." Talks about self in the 3rd person. Appears to be paranoid. Declines further evaluation.     Per BSMART note, Patient is a 37 year old female seen face to face in the ER.  She came to the ER reporting suicidal ideation without a plan.  She reported she was psychiatrically admitted to [facility] in March and upon discharge was "sent to a crack house" to live so she left.  She continuously spoke about being "tired of reaching out for help and being turned away."  She reported she has been in [place] since 2016.  She has been homeless a good part of this time.  She was very focused on not wanting to be around "crack heads" and having issues with housing being around people using crack.  She reported she feels hopeless.  She denied homicidal ideation or symptoms of psychosis.  However, she was observed by nursing staff apparently responding to internal stimuli.  Patient appears to be manic, as she is very irritable and at times made statement that were difficult to follow.  She reported she had a stroke in 2018 and since then "anything I take can upset my brain," so she hasn't taken medication consistently since then.  She reported she has a history of suicide attempts but would not provide any information about what she did or when  they occurred.  She talked about [facility] not helping her, so [facility] was contacted for collateral.  They reported she has a history of Schizoaffective Disorder Bipolar Type and was closed to services in May 2018 due to treatment noncompliance.  Patient is requesting admission.  Since we have no information about her baseline and level of irritability on a day to day basis, she will be admitted for treatment.    4/22- patient has been labile, responding to internal stimuli, provides a grossly disorganized narrative. Patient continues to be labile, irritable, misperceiving interactions with staff, refusing medications. Patient denies, then endorses SI and PI. Patient informed that treatment would consist of mood stabilizer and antipsychotics, and began to scream about not needing medication, and insisting she has a seizure disorder and not psychosis. Refused PRNs, was redirectable.    ??      SIDE EFFECTS: (reviewed/updated 07/04/2017)  None reported or admitted to.     ALLERGIES:(reviewed/updated 07/04/2017)  Allergies   Allergen Reactions   ??? Penicillins Hives and Itching      MEDICATIONS PRIOR TO ADMISSION:(reviewed/updated 07/04/2017)  Medications Prior to Admission   Medication Sig   ??? ziprasidone (GEODON) 80 mg capsule Take 160 mg by mouth nightly.   ??? benztropine (COGENTIN) 2 mg tablet Take 1  mg by mouth two (2) times a day.   ??? OXcarbaxepine (TRILEPTAL) 600 mg tablet Take 600 mg by mouth two (2) times a day.   ??? lisinopril (PRINIVIL, ZESTRIL) 10 mg tablet Take 10 mg by mouth daily.   ??? esomeprazole (NEXIUM) 40 mg capsule Take 40 mg by mouth daily.   ??? acetaminophen (TYLENOL) 325 mg tablet Take 2 Tabs by mouth every four (4) hours as needed for Pain.   ??? naproxen (NAPROSYN) 375 mg tablet Take 1 Tab by mouth two (2) times daily (with meals).      PAST MEDICAL HISTORY: Past medical history from the initial psychiatric evaluation has been reviewed (reviewed/updated 07/04/2017) with no  additional updates (I asked patient and no additional past medical history provided). Past Medical History:   Diagnosis Date   ??? Congestive heart failure (CHF) (HCC) 2011   ??? Hypertension    ??? Psychiatric disorder     Bipolar   ??? Stroke [facility](HCC)    History reviewed. No pertinent surgical history.   SOCIAL HISTORY: Social history from the initial psychiatric evaluation has been reviewed (reviewed/updated 07/04/2017) with no additional updates (I asked patient and no additional social history provided). Social History     Socioeconomic History   ??? Marital status: SINGLE     Spouse name: Not on file   ??? Number of children: Not on file   ??? Years of education: Not on file   ??? Highest education level: Not on file   Occupational History   ??? Not on file   Social Needs   ??? Financial resource strain: Not on file   ??? Food insecurity:     Worry: Not on file     Inability: Not on file   ??? Transportation needs:     Medical: Not on file     Non-medical: Not on file   Tobacco Use   ??? Smoking status: Current Every Day Smoker     Packs/day: 1.00     Years: 14.00     Pack years: 14.00   ??? Smokeless tobacco: Never Used   Substance and Sexual Activity   ??? Alcohol use: No   ??? Drug use: Yes     Types: Marijuana   ??? Sexual activity: Not on file   Lifestyle   ??? Physical activity:     Days per week: Not on file     Minutes per session: Not on file   ??? Stress: Not on file   Relationships   ??? Social connections:     Talks on phone: Not on file     Gets together: Not on file     Attends religious service: Not on file     Active member of club or organization: Not on file     Attends meetings of clubs or organizations: Not on file     Relationship status: Not on file   ??? Intimate partner violence:     Fear of current or ex partner: Not on file     Emotionally abused: Not on file     Physically abused: Not on file     Forced sexual activity: Not on file   Other Topics Concern   ??? Not on file   Social History Narrative   ??? Not on file       FAMILY HISTORY: Family history from the initial psychiatric evaluation has been reviewed (reviewed/updated 07/04/2017) with no additional updates (I asked patient and no additional family history provided).  History reviewed. No pertinent family history.    REVIEW OF SYSTEMS: (reviewed/updated 07/04/2017)  Appetite:no change from normal   Sleep: poor with DIMS (difficulty initiating & maintaining sleep)   All other Review of Systems: Negative except per HPI         MENTAL STATUS EXAM & VITALS     MENTAL STATUS EXAM (MSE):    MSE FINDINGS ARE WITHIN NORMAL LIMITS (WNL) UNLESS OTHERWISE STATED BELOW. ( ALL OF THE BELOW CATEGORIES OF THE MSE HAVE BEEN REVIEWED (reviewed 07/04/2017) AND UPDATED AS DEEMED APPROPRIATE )  General Presentation age appropriate, uncooperative   Orientation not oriented to situation   Vital Signs  See below (reviewed 07/04/2017); Vital Signs (BP, Pulse, & Temp) are within normal limits if not listed below.   Gait and Station Stable/steady, no ataxia   Musculoskeletal System No extrapyramidal symptoms (EPS); no abnormal muscular movements or Tardive Dyskinesia (TD); muscle strength and tone are within normal limits   Language No aphasia or dysarthria   Speech:  hyperverbal and pressured   Thought Processes illogical; fast rate of thoughts; poor abstract reasoning/computation   Thought Associations loose associations and tangential   Thought Content paranoid delusions, grandiose delusions and preoccupations   Suicidal Ideations intention   Homicidal Ideations plan   Mood:  hostile  and labile    Affect:  full range, increased in intensity and irritable   Memory recent  intact   Memory remote:  intact   Concentration/Attention:  intact   Fund of Knowledge average   Insight:  limited   Reliability poor   Judgment:  poor          VITALS:     Patient Vitals for the past 24 hrs:   Temp Pulse Resp BP SpO2   07/04/17 0803 98.5 ??F (36.9 ??C) 95 18 125/89 100 %     Wt Readings from Last 3 Encounters:    07/01/17 107.5 kg (237 lb)   12/12/14 108.9 kg (240 lb)     Temp Readings from Last 3 Encounters:   07/04/17 98.5 ??F (36.9 ??C)   12/12/14 98.7 ??F (37.1 ??C)     BP Readings from Last 3 Encounters:   07/04/17 125/89   12/12/14 (!) 138/97     Pulse Readings from Last 3 Encounters:   07/04/17 95   12/12/14 79            DATA     LABORATORY DATA:(reviewed/updated 07/04/2017)  No results found for this or any previous visit (from the past 24 hour(s)).  No results found for: VALF2, VALAC, VALP, VALPR, DS6, CRBAM, CRBAMP, CARB2, XCRBAM  No results found for: LITHM   RADIOLOGY REPORTS:(reviewed/updated 07/04/2017)  No results found.       MEDICATIONS     ALL MEDICATIONS:   Current Facility-Administered Medications   Medication Dose Route Frequency   ??? ziprasidone (GEODON) 10 mg in sterile water (preservative free) 0.5 mL injection  10 mg IntraMUSCular Q6H PRN   ??? OLANZapine (ZyPREXA) tablet 5 mg  5 mg Oral Q4H PRN   ??? benztropine (COGENTIN) tablet 2 mg  2 mg Oral BID PRN   ??? benztropine (COGENTIN) injection 2 mg  2 mg IntraMUSCular BID PRN   ??? LORazepam (ATIVAN) injection 2 mg  2 mg IntraMUSCular Q4H PRN   ??? LORazepam (ATIVAN) tablet 1 mg  1 mg Oral Q4H PRN   ??? acetaminophen (TYLENOL) tablet 650 mg  650 mg Oral Q4H PRN   ??? ibuprofen (MOTRIN) tablet  400 mg  400 mg Oral Q8H PRN   ??? magnesium hydroxide (MILK OF MAGNESIA) 400 mg/5 mL oral suspension 30 mL  30 mL Oral DAILY PRN   ??? nicotine (NICODERM CQ) 21 mg/24 hr patch 1 Patch  1 Patch TransDERmal DAILY PRN   ??? hydrOXYzine HCl (ATARAX) tablet 50 mg  50 mg Oral Q6H PRN   ??? traZODone (DESYREL) tablet 50 mg  50 mg Oral QHS PRN   ??? lisinopril (PRINIVIL, ZESTRIL) tablet 10 mg  10 mg Oral DAILY   ??? pantoprazole (PROTONIX) tablet 40 mg  40 mg Oral DAILY   ??? OLANZapine (ZyPREXA) tablet 5 mg  5 mg Oral BID      SCHEDULED MEDICATIONS:   Current Facility-Administered Medications   Medication Dose Route Frequency   ??? lisinopril (PRINIVIL, ZESTRIL) tablet 10 mg  10 mg Oral DAILY    ??? pantoprazole (PROTONIX) tablet 40 mg  40 mg Oral DAILY   ??? OLANZapine (ZyPREXA) tablet 5 mg  5 mg Oral BID        ASSESSMENT & PLAN     DIAGNOSES REQUIRING ACTIVE TREATMENT AND MONITORING: (reviewed/updated 07/04/2017)  Patient Active Hospital Problem List:   Bipolar disorder (HCC) (07/01/2017)    Assessment: patient grossly manic, does not want medications. Patient has been visible, having ongoing paranoid delusions and screaming at patients.     Plan:  - Reassess for voluntary status  - START Invega 6 mg QDAY for psychosis  - START VPA 500 mg Q12H for mood lability    I will continue to monitor blood levels (Depakote, Tegretol, lithium, clozapine---a drug with a narrow therapeutic index= NTI) and associated labs for drug therapy implemented that require intense monitoring for toxicity as deemed appropriate based on current medication side effects and pharmacodynamically determined drug 1/2 lives.    In summary, Kellan Cressler, is a 37 y.o.  female who presents with a severe exacerbation of the principal diagnosis of bipolar mania with psychotic features.    Patient's condition is worsening.    Patient requires continued inpatient hospitalization for further stabilization, safety monitoring and medication management.  I will continue to coordinate the provision of individual, milieu, occupational, group, and substance abuse therapies to address target symptoms/diagnoses as deemed appropriate for the individual patient.  A coordinated, multidisplinary treatment team round was conducted with the patient (this team consists of the nurse, psychiatric unit pharmcist, Administrator).     Complete current electronic health record for patient has been reviewed today including consultant notes, ancillary staff notes, nurses and psychiatric tech notes.    Suicide risk assessment completed and patient deemed to be of low risk for suicide at this time.      The following regarding medications was addressed during rounds with patient:   the risks and benefits of the proposed medication. The patient was given the opportunity to ask questions. Informed consent given to the use of the above medications. Will continue to adjust psychiatric and non-psychiatric medications (see above "medication" section and orders section for details) as deemed appropriate & based upon diagnoses and response to treatment.     I will continue to order blood tests/labs and diagnostic tests as deemed appropriate and review results as they become available (see orders for details and above listed lab/test results).    I will order psychiatric records from previous psych hospitals to further elucidate the nature of patient's psychopathology and review once available.    I will gather additional collateral information  from friends, family and o/p treatment team to further elucidate the nature of patient's psychopathology and baselline level of psychiatric functioning.         I certify that this patient's inpatient psychiatric hospital services furnished since the previous certification were, and continue to be, required for treatment that could reasonably be expected to improve the patient's condition, or for diagnostic study, and that the patient continues to need, on a daily basis, active treatment furnished directly by or requiring the supervision of inpatient psychiatric facility personnel. In addition, the hospital records show that services furnished were intensive treatment services, admission or related services, or equivalent services.    EXPECTED DISCHARGE DATE/DAY: TBD     DISPOSITION: Shelter       Signed By:   Hiram Gash, MD  07/04/2017

## 2017-07-04 NOTE — Progress Notes (Signed)
Pt is irritable. Responding to internal stimuli. Hyper verbal. Loud. Verbally aggressive. Disorganized in conversation. Refusing medications. Paranoid. Pt was TDO'd by crises today. Isolative. Encouraged to attend groups. Will continue to monitor pt and assess needs.

## 2017-07-04 NOTE — Behavioral Health Treatment Team (Signed)
PSYCHOSOCIAL ASSESSMENT  :Patient identifying info:  Joan Molina is a 37 y.o., female admitted 07/01/2017  8:09 PM     Presenting problem and precipitating factors: "I'm hopeless, the system has failed to do their part."   Pt was a poor historian, irritable and guarded providing a grossly disorganized narrative.  Pt stated that she was discharged from [place] in mid March and was accepted into a living situation she described as a "crack house".      Mental status assessment:  Pt is alert and oriented. Pt denies SI/HI. Pt's mood is irritable, labile, affect is mood congruent.  Pt's thought process is disorganized.  Pt's insight and judgment is, reliability is .      Collateral information: none. Non ROIs given.     Current psychiatric /substance abuse providers and contact info: none. [facility]    Previous psychiatric/substance abuse providers and response to treatment: Pt reports between 30-40 psychiatric hospitalizations    Family history of mental illness or substance abuse: unknown.     Substance abuse history:  UDS + THC; BAL=0  Social History     Tobacco Use   ??? Smoking status: Current Every Day Smoker     Packs/day: 1.00     Years: 14.00     Pack years: 14.00   ??? Smokeless tobacco: Never Used   Substance Use Topics   ??? Alcohol use: No       History of biomedical complications associated with substance abuse : unknown     Patient's current acceptance of treatment or motivation for change:  Unmotivated and unwilling to accept medications.     Family constellation: Pt is single and does not have children.     Is significant other involved? n/a      Describe support system:  Reports no support in community.     Describe living arrangements and home environment:  homeless    Health issues:   Hospital Problems  Never Reviewed          Codes Class Noted POA    * (Principal) Bipolar 1 disorder, mixed, severe (HCC) ICD-10-CM: F31.63  ICD-9-CM: 296.63  07/04/2017 Unknown               Trauma history: unknown.    Legal issues: denied.     History of military service: none.     Financial status: SSDI    Religious/cultural factors: none expressed at this time.     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     Leisure and recreation preferences:  unknown.     Describe coping skills:  Poor     Joan Molina  07/04/2017

## 2017-07-05 MED ORDER — CLONAZEPAM 1 MG TAB
1 mg | Freq: Two times a day (BID) | ORAL | Status: DC
Start: 2017-07-05 — End: 2017-07-08
  Administered 2017-07-05 – 2017-07-08 (×6): via ORAL

## 2017-07-05 MED ORDER — OXCARBAZEPINE 150 MG TAB
150 mg | Freq: Two times a day (BID) | ORAL | Status: DC
Start: 2017-07-05 — End: 2017-07-06
  Administered 2017-07-05 – 2017-07-06 (×2): via ORAL

## 2017-07-05 MED ORDER — ZIPRASIDONE MESYLATE 20 MG IM SOLR
20 mg/mL (final conc.) | Freq: Two times a day (BID) | INTRAMUSCULAR | Status: DC | PRN
Start: 2017-07-05 — End: 2017-07-11

## 2017-07-05 MED ORDER — OXCARBAZEPINE 150 MG TAB
150 mg | Freq: Two times a day (BID) | ORAL | Status: DC
Start: 2017-07-05 — End: 2017-07-05

## 2017-07-05 MED ORDER — ZIPRASIDONE MESYLATE 20 MG IM SOLR
20. mg/mL (final conc.) | Freq: Once | INTRAMUSCULAR | Status: AC
Start: 2017-07-05 — End: 2017-07-05
  Administered 2017-07-05: 12:00:00 via INTRAMUSCULAR

## 2017-07-05 MED ORDER — ZIPRASIDONE MESYLATE 20 MG IM SOLR
20 mg/mL (final conc.) | Freq: Two times a day (BID) | INTRAMUSCULAR | Status: DC | PRN
Start: 2017-07-05 — End: 2017-07-05

## 2017-07-05 MED FILL — LISINOPRIL 5 MG TAB: 5 mg | ORAL | Qty: 2

## 2017-07-05 MED FILL — TRAZODONE 50 MG TAB: 50 mg | ORAL | Qty: 1

## 2017-07-05 MED FILL — PANTOPRAZOLE 40 MG TAB, DELAYED RELEASE: 40 mg | ORAL | Qty: 1

## 2017-07-05 MED FILL — DIVALPROEX 500 MG TAB, DELAYED RELEASE: 500 mg | ORAL | Qty: 1

## 2017-07-05 MED FILL — MELATONIN 3 MG TAB: 3 mg | ORAL | Qty: 2

## 2017-07-05 MED FILL — HYDROXYZINE 25 MG TAB: 25 mg | ORAL | Qty: 2

## 2017-07-05 MED FILL — LORAZEPAM 2 MG/ML IJ SOLN: 2 mg/mL | INTRAMUSCULAR | Qty: 1

## 2017-07-05 MED FILL — PALIPERIDONE SR 3 MG 24 HR TAB: 3 mg | ORAL | Qty: 2

## 2017-07-05 MED FILL — VITAMINS B COMPLEX CAPSULE: ORAL | Qty: 1

## 2017-07-05 MED FILL — CLONAZEPAM 1 MG TAB: 1 mg | ORAL | Qty: 1

## 2017-07-05 MED FILL — GEODON 20 MG/ML (FINAL CONCENTRATION) INTRAMUSCULAR SOLUTION: 20 mg/mL (final conc.) | INTRAMUSCULAR | Qty: 20

## 2017-07-05 MED FILL — NICOTINE 21 MG/24 HR DAILY PATCH: 21 mg/24 hr | TRANSDERMAL | Qty: 1

## 2017-07-05 MED FILL — OXCARBAZEPINE 150 MG TAB: 150 mg | ORAL | Qty: 2

## 2017-07-05 NOTE — Behavioral Health Treatment Team (Cosign Needed)
Pt did not attend creative expression group.

## 2017-07-05 NOTE — Behavioral Health Treatment Team (Signed)
Social work process group    Patient did not attend social work process group.

## 2017-07-05 NOTE — Behavioral Health Treatment Team (Cosign Needed Addendum)
Patient [facility] for a [place] TDO and was committed.  Patient also received court ordered medication.

## 2017-07-05 NOTE — Progress Notes (Incomplete)
[id number] Karsten RoLaWanda Hubbard RN received report from [employer] Mining[employer]ricia McNeil.    0810 Pt presents with a hostile and guarded attitude. Pt presents with an angry affect and mood. Pt presents with auditory hallucinations and excessively loud and aggressive speech. Pt presents with anxiety and paranoia. Pt received prn atarax.     0825 Pt became increasingly agitated when informed that she could not have a plastic water bottle. Pt stated "I just want my B complex and

## 2017-07-05 NOTE — Behavioral Health Treatment Team (Cosign Needed)
Pt did not attend coping skills group.

## 2017-07-05 NOTE — Behavioral Health Treatment Team (Signed)
PSYCHIATRIC PROGRESS NOTE       Patient Name  Joan Molina   Date of Birth 02-Jun-1980   CSN [id number]   Medical Record Number  [id number]      Age  37 y.o.   PCP None   Admit date:  07/01/2017    Room Number  313/02  @ [facility]   Date of Service  07/05/2017         E & M PROGRESS NOTE:       HISTORY       CC:  "I am in a hopeless situation"HISTORY OF PRESENT ILLNESS/INTERVAL HISTORY:  (reviewed/updated 07/05/2017).  per initial evaluation: Patient was not cooperative. Gave only scanty information. Reports she has become hopeless because she is homeless. Was recently in [place] and says she was discharged to a "crack house."  Feels "alienated and hopeless." Talks about self in the 3rd person. Appears to be paranoid. Declines further evaluation.     Per BSMART note, Patient is a 37 year old female seen face to face in the ER.  She came to the ER reporting suicidal ideation without a plan.  She reported she was psychiatrically admitted to [facility] in March and upon discharge was "sent to a crack house" to live so she left.  She continuously spoke about being "tired of reaching out for help and being turned away."  She reported she has been in [place] since 2016.  She has been homeless a good part of this time.  She was very focused on not wanting to be around "crack heads" and having issues with housing being around people using crack.  She reported she feels hopeless.  She denied homicidal ideation or symptoms of psychosis.  However, she was observed by nursing staff apparently responding to internal stimuli.  Patient appears to be manic, as she is very irritable and at times made statement that were difficult to follow.  She reported she had a stroke in 2018 and since then "anything I take can upset my brain," so she hasn't taken medication consistently since then.  She reported she has a history of suicide attempts but would not provide any information about what she did or when  they occurred.  She talked about [facility] not helping her, so [facility] was contacted for collateral.  They reported she has a history of Schizoaffective Disorder Bipolar Type and was closed to services in May 2018 due to treatment noncompliance.  Patient is requesting admission.  Since we have no information about her baseline and level of irritability on a day to day basis, she will be admitted for treatment.    4/22- patient has been labile, responding to internal stimuli, provides a grossly disorganized narrative. Patient continues to be labile, irritable, misperceiving interactions with staff, refusing medications. Patient denies, then endorses SI and PI. Patient informed that treatment would consist of mood stabilizer and antipsychotics, and began to scream about not needing medication, and insisting she has a seizure disorder and not psychosis. Refused PRNs, was redirectable.  4/23 - Pt was TDO'd by crisis yesterday. Later came to RN station, became agitated without any apparent provocation, demanded medications but refused antispsychotic and mood stabilizer. Patient placed into seclusion due to agitation, got Geodon 20 mg IM. Patient states that she had 'liver problems' due to Depakote in the past, but is amenable to taking Trileptal.  ??      SIDE EFFECTS: (reviewed/updated 07/05/2017)  None reported or admitted to.     ALLERGIES:(reviewed/updated  07/05/2017)  Allergies   Allergen Reactions   ??? Penicillins Hives and Itching      MEDICATIONS PRIOR TO ADMISSION:(reviewed/updated 07/05/2017)  Medications Prior to Admission   Medication Sig   ??? ziprasidone (GEODON) 80 mg capsule Take 160 mg by mouth nightly.   ??? benztropine (COGENTIN) 2 mg tablet Take 1 mg by mouth two (2) times a day.   ??? OXcarbaxepine (TRILEPTAL) 600 mg tablet Take 600 mg by mouth two (2) times a day.   ??? lisinopril (PRINIVIL, ZESTRIL) 10 mg tablet Take 10 mg by mouth daily.   ??? esomeprazole (NEXIUM) 40 mg capsule Take 40 mg by mouth daily.    ??? acetaminophen (TYLENOL) 325 mg tablet Take 2 Tabs by mouth every four (4) hours as needed for Pain.   ??? naproxen (NAPROSYN) 375 mg tablet Take 1 Tab by mouth two (2) times daily (with meals).      PAST MEDICAL HISTORY: Past medical history from the initial psychiatric evaluation has been reviewed (reviewed/updated 07/05/2017) with no additional updates (I asked patient and no additional past medical history provided).   Past Medical History:   Diagnosis Date   ??? Congestive heart failure (CHF) (HCC) 2011   ??? Hypertension    ??? Psychiatric disorder     Bipolar   ??? Stroke [facility])    History reviewed. No pertinent surgical history.   SOCIAL HISTORY: Social history from the initial psychiatric evaluation has been reviewed (reviewed/updated 07/05/2017) with no additional updates (I asked patient and no additional social history provided).   Social History     Socioeconomic History   ??? Marital status: SINGLE     Spouse name: Not on file   ??? Number of children: Not on file   ??? Years of education: Not on file   ??? Highest education level: Not on file   Occupational History   ??? Not on file   Social Needs   ??? Financial resource strain: Not on file   ??? Food insecurity:     Worry: Not on file     Inability: Not on file   ??? Transportation needs:     Medical: Not on file     Non-medical: Not on file   Tobacco Use   ??? Smoking status: Current Every Day Smoker     Packs/day: 1.00     Years: 14.00     Pack years: 14.00   ??? Smokeless tobacco: Never Used   Substance and Sexual Activity   ??? Alcohol use: No   ??? Drug use: Yes     Types: Marijuana   ??? Sexual activity: Not on file   Lifestyle   ??? Physical activity:     Days per week: Not on file     Minutes per session: Not on file   ??? Stress: Not on file   Relationships   ??? Social connections:     Talks on phone: Not on file     Gets together: Not on file     Attends religious service: Not on file     Active member of club or organization: Not on file      Attends meetings of clubs or organizations: Not on file     Relationship status: Not on file   ??? Intimate partner violence:     Fear of current or ex partner: Not on file     Emotionally abused: Not on file     Physically abused: Not on file  Forced sexual activity: Not on file   Other Topics Concern   ??? Not on file   Social History Narrative   ??? Not on file      FAMILY HISTORY: Family history from the initial psychiatric evaluation has been reviewed (reviewed/updated 07/05/2017) with no additional updates (I asked patient and no additional family history provided). History reviewed. No pertinent family history.    REVIEW OF SYSTEMS: (reviewed/updated 07/05/2017)  Appetite:no change from normal   Sleep: poor with DIMS (difficulty initiating & maintaining sleep)   All other Review of Systems: Negative except per HPI         MENTAL STATUS EXAM & VITALS     MENTAL STATUS EXAM (MSE):    MSE FINDINGS ARE WITHIN NORMAL LIMITS (WNL) UNLESS OTHERWISE STATED BELOW. ( ALL OF THE BELOW CATEGORIES OF THE MSE HAVE BEEN REVIEWED (reviewed 07/05/2017) AND UPDATED AS DEEMED APPROPRIATE )  General Presentation age appropriate, uncooperative   Orientation not oriented to situation   Vital Signs  See below (reviewed 07/05/2017); Vital Signs (BP, Pulse, & Temp) are within normal limits if not listed below.   Gait and Station Stable/steady, no ataxia   Musculoskeletal System No extrapyramidal symptoms (EPS); no abnormal muscular movements or Tardive Dyskinesia (TD); muscle strength and tone are within normal limits   Language No aphasia or dysarthria   Speech:  normal volume and pressured   Thought Processes illogical; normal rate of thoughts; poor abstract reasoning/computation   Thought Associations loose associations and tangential   Thought Content paranoid delusions, grandiose delusions and preoccupations   Suicidal Ideations intention   Homicidal Ideations plan   Mood:  hostile  and labile     Affect:  full range, increased in intensity and irritable   Memory recent  intact   Memory remote:  intact   Concentration/Attention:  intact   Fund of Knowledge average   Insight:  limited   Reliability poor   Judgment:  poor          VITALS:     Patient Vitals for the past 24 hrs:   Pulse Resp BP   07/04/17 2125 ??? 18 ???     Wt Readings from Last 3 Encounters:   07/01/17 107.5 kg (237 lb)   12/12/14 108.9 kg (240 lb)     Temp Readings from Last 3 Encounters:   07/04/17 98.5 ??F (36.9 ??C)   12/12/14 98.7 ??F (37.1 ??C)     BP Readings from Last 3 Encounters:   07/04/17 125/89   12/12/14 (!) 138/97     Pulse Readings from Last 3 Encounters:   07/04/17 95   12/12/14 79            DATA     LABORATORY DATA:(reviewed/updated 07/05/2017)  No results found for this or any previous visit (from the past 24 hour(s)).  No results found for: VALF2, VALAC, VALP, VALPR, DS6, CRBAM, CRBAMP, CARB2, XCRBAM  No results found for: LITHM   RADIOLOGY REPORTS:(reviewed/updated 07/05/2017)  No results found.       MEDICATIONS     ALL MEDICATIONS:   Current Facility-Administered Medications   Medication Dose Route Frequency   ??? ziprasidone (GEODON) 20 mg in sterile water (preservative free) 1 mL injection  20 mg IntraMUSCular BID PRN   ??? OXcarbazepine (TRILEPTAL) tablet 300 mg  300 mg Oral Q12H   ??? paliperidone (INVEGA) SR tablet 6 mg  6 mg Oral DAILY   ??? OLANZapine (ZyPREXA) tablet 5 mg  5  mg Oral Q4H PRN   ??? melatonin tablet 6 mg  6 mg Oral QHS   ??? vitamin B complex capsule 1 Cap  1 Cap Oral DAILY   ??? benztropine (COGENTIN) tablet 2 mg  2 mg Oral BID PRN   ??? benztropine (COGENTIN) injection 2 mg  2 mg IntraMUSCular BID PRN   ??? LORazepam (ATIVAN) injection 2 mg  2 mg IntraMUSCular Q4H PRN   ??? acetaminophen (TYLENOL) tablet 650 mg  650 mg Oral Q4H PRN   ??? ibuprofen (MOTRIN) tablet 400 mg  400 mg Oral Q8H PRN   ??? magnesium hydroxide (MILK OF MAGNESIA) 400 mg/5 mL oral suspension 30 mL  30 mL Oral DAILY PRN    ??? nicotine (NICODERM CQ) 21 mg/24 hr patch 1 Patch  1 Patch TransDERmal DAILY PRN   ??? hydrOXYzine HCl (ATARAX) tablet 50 mg  50 mg Oral Q6H PRN   ??? traZODone (DESYREL) tablet 50 mg  50 mg Oral QHS PRN   ??? lisinopril (PRINIVIL, ZESTRIL) tablet 10 mg  10 mg Oral DAILY   ??? pantoprazole (PROTONIX) tablet 40 mg  40 mg Oral DAILY      SCHEDULED MEDICATIONS:   Current Facility-Administered Medications   Medication Dose Route Frequency   ??? OXcarbazepine (TRILEPTAL) tablet 300 mg  300 mg Oral Q12H   ??? paliperidone (INVEGA) SR tablet 6 mg  6 mg Oral DAILY   ??? melatonin tablet 6 mg  6 mg Oral QHS   ??? vitamin B complex capsule 1 Cap  1 Cap Oral DAILY   ??? lisinopril (PRINIVIL, ZESTRIL) tablet 10 mg  10 mg Oral DAILY   ??? pantoprazole (PROTONIX) tablet 40 mg  40 mg Oral DAILY        ASSESSMENT & PLAN     DIAGNOSES REQUIRING ACTIVE TREATMENT AND MONITORING: (reviewed/updated 07/05/2017)  Patient Active Hospital Problem List:   Bipolar disorder (HCC) (07/01/2017)    Assessment: patient grossly manic, does not want medications. Patient has been visible, having ongoing paranoid delusions and screaming at patients.     Plan:  - Reassess for voluntary status  - START Invega 6 mg QDAY for psychosis  - START Trileptal 300 mg Q12H for mania  - DISCONTINUE VPA due to hx of liver dysfunction  - START Klonopin 1 mg BID for agitation    I will continue to monitor blood levels (Depakote, Tegretol, lithium, clozapine---a drug with a narrow therapeutic index= NTI) and associated labs for drug therapy implemented that require intense monitoring for toxicity as deemed appropriate based on current medication side effects and pharmacodynamically determined drug 1/2 lives.    In summary, Tomeshia Chatmon, is a 37 y.o.  female who presents with a severe exacerbation of the principal diagnosis of bipolar mania with psychotic features.    Patient's condition is not improving.    Patient requires continued inpatient hospitalization for further  stabilization, safety monitoring and medication management.  I will continue to coordinate the provision of individual, milieu, occupational, group, and substance abuse therapies to address target symptoms/diagnoses as deemed appropriate for the individual patient.  A coordinated, multidisplinary treatment team round was conducted with the patient (this team consists of the nurse, psychiatric unit pharmcist, Administrator).     Complete current electronic health record for patient has been reviewed today including consultant notes, ancillary staff notes, nurses and psychiatric tech notes.    Suicide risk assessment completed and patient deemed to be of low risk for suicide at this  time.     The following regarding medications was addressed during rounds with patient:   the risks and benefits of the proposed medication. The patient was given the opportunity to ask questions. Informed consent given to the use of the above medications. Will continue to adjust psychiatric and non-psychiatric medications (see above "medication" section and orders section for details) as deemed appropriate & based upon diagnoses and response to treatment.     I will continue to order blood tests/labs and diagnostic tests as deemed appropriate and review results as they become available (see orders for details and above listed lab/test results).    I will order psychiatric records from previous psych hospitals to further elucidate the nature of patient's psychopathology and review once available.    I will gather additional collateral information from friends, family and o/p treatment team to further elucidate the nature of patient's psychopathology and baselline level of psychiatric functioning.         I certify that this patient's inpatient psychiatric hospital services furnished since the previous certification were, and continue to be, required for treatment that could reasonably be expected to improve the  patient's condition, or for diagnostic study, and that the patient continues to need, on a daily basis, active treatment furnished directly by or requiring the supervision of inpatient psychiatric facility personnel. In addition, the hospital records show that services furnished were intensive treatment services, admission or related services, or equivalent services.    EXPECTED DISCHARGE DATE/DAY: TBD     DISPOSITION: Shelter       Signed By:   Hiram Gash, MD  07/05/2017

## 2017-07-05 NOTE — Behavioral Health Treatment Team (Addendum)
0900 pt came out of room. extremely agitated. Yelling at staff and other patients. Pt was asked to calm down. Staff was willing to assist her with her needs. Pt continued to escalate and demanding items staff. Pt demanded toiletries, vistaril and nicotine patch. Pt continues to refuse scheduled mood stabilizer and antipsychoic medication. while waiting for medication pt continued to yell and scream at staff. Verbally aggressive and threatening. Staff unable to verbally de escalate pt. Continued to curse and threaten staff. Disruptive to Milieu. Psychotic. Pt was danger to self and others at this time. security called.  MD notified. Geodon 20 mg and ativan 2 mg IM was given. Without physical hold. Pt requested medication to be given in right deltoid.  Pt arm was bleeding from injection but refused nurse to help by holding pressure to sight. Pt continued to curse and yell for a while after medication. Eventually went into her room.     1000 pt is room sleeping at this time. Medication effective. Will continue to monitor pt and assess needs.

## 2017-07-05 NOTE — Progress Notes (Addendum)
[id number] Joan RoLaWanda Hubbard RN received report from Newmont MiningPatricia McNeil.       0800 Pt presents with a hostile and guarded attitude. Pt presents with an angry affect. Pt presents with an irritable and aggressive mood. Pt presents with anxiety and agitation. Pt became increasingly agitated when informed that she could not have the plastic water bottle to take her medication with. Pt stated " I just want my B complex, nicotine patch, and atarax." Pt refused all other medications. Pt began screaming exploitatives at this writer and Monique when asked to calm down. This Clinical research associatewriter and other staff were unable to deescalate the pt. Pt proceeded to yell and scream and threaten staff. Pt disruptive to milieu and a danger to self and others. Security called, MD notified, Geodon 20mg  and ativan 2mg  IM given in right deltoid. Ptt stated that she not want a band aid to stop bleeding after injection was given. Pt went to her room and eventually calmed down.     1200 Pt resting in room, in bed. Pt in bed sleeping unable to educate pt.    1513 Pt ambulating in hallway interacting appropriately with staff. Pt calm and cooperative.

## 2017-07-06 MED ORDER — CALCIUM CARBONATE 200 MG (500 MG) CHEWABLE TAB
200 mg calcium (500 mg) | Freq: Two times a day (BID) | ORAL | Status: DC
Start: 2017-07-06 — End: 2017-07-11
  Administered 2017-07-06 – 2017-07-11 (×10): via ORAL

## 2017-07-06 MED ORDER — ZIPRASIDONE 20 MG CAP
20 mg | Freq: Two times a day (BID) | ORAL | Status: DC
Start: 2017-07-06 — End: 2017-07-07
  Administered 2017-07-06 – 2017-07-07 (×3): via ORAL

## 2017-07-06 MED ORDER — HALOPERIDOL LACTATE 5 MG/ML IJ SOLN
5 mg/mL | Freq: Every day | INTRAMUSCULAR | Status: DC
Start: 2017-07-06 — End: 2017-07-06

## 2017-07-06 MED ORDER — OXCARBAZEPINE 150 MG TAB
150 mg | Freq: Two times a day (BID) | ORAL | Status: DC
Start: 2017-07-06 — End: 2017-07-11
  Administered 2017-07-06 – 2017-07-11 (×10): via ORAL

## 2017-07-06 MED ORDER — ZIPRASIDONE 20 MG CAP
20 mg | Freq: Two times a day (BID) | ORAL | Status: DC
Start: 2017-07-06 — End: 2017-07-06

## 2017-07-06 MED ORDER — PALIPERIDONE SR 3 MG 24 HR TAB
3 mg | Freq: Every day | ORAL | Status: DC
Start: 2017-07-06 — End: 2017-07-06
  Administered 2017-07-06: 15:00:00 via ORAL

## 2017-07-06 MED ORDER — BENZTROPINE 1 MG TAB
1 mg | Freq: Two times a day (BID) | ORAL | Status: DC
Start: 2017-07-06 — End: 2017-07-11
  Administered 2017-07-06 – 2017-07-11 (×11): via ORAL

## 2017-07-06 MED FILL — CALCIUM CARBONATE 200 MG (500 MG) CHEWABLE TAB: 200 mg calcium (500 mg) | ORAL | Qty: 1

## 2017-07-06 MED FILL — OXCARBAZEPINE 150 MG TAB: 150 mg | ORAL | Qty: 4

## 2017-07-06 MED FILL — BENZTROPINE 1 MG TAB: 1 mg | ORAL | Qty: 1

## 2017-07-06 MED FILL — CLONAZEPAM 1 MG TAB: 1 mg | ORAL | Qty: 1

## 2017-07-06 MED FILL — HYDROXYZINE 25 MG TAB: 25 mg | ORAL | Qty: 2

## 2017-07-06 MED FILL — MELATONIN 3 MG TAB: 3 mg | ORAL | Qty: 2

## 2017-07-06 MED FILL — OXCARBAZEPINE 150 MG TAB: 150 mg | ORAL | Qty: 2

## 2017-07-06 MED FILL — ZIPRASIDONE 20 MG CAP: 20 mg | ORAL | Qty: 1

## 2017-07-06 MED FILL — VITAMINS B COMPLEX CAPSULE: ORAL | Qty: 1

## 2017-07-06 MED FILL — PANTOPRAZOLE 40 MG TAB, DELAYED RELEASE: 40 mg | ORAL | Qty: 1

## 2017-07-06 NOTE — Progress Notes (Addendum)
Pt is visible on the unit at different times throughout the day. Pt had arguments with peers today but was redirectable. Pt attended groups only for short period today. Pt states she felt better today. Denies SI/HI/A/V hall. Taking medications PO with encouragement. Alert. Pt does require redirection. Will continue to monitor pt and assess needs.     Written by Melynda KellerMealy RN

## 2017-07-06 NOTE — Behavioral Health Treatment Team (Cosign Needed)
Pt did not attend coping skills group.

## 2017-07-06 NOTE — Behavioral Health Treatment Team (Signed)
Pt rested in bed at beginning of shift.  Pt had an outburst, pressured speech, loud voice tones, and cussing out staff d/t interrupting her rest.  Pt refused melatonin.     Pt awoke in the middle of the night and was polite with staff.  Requested clothes that were washed and some were given to pt and some put in storage on unit.      Pt had no complaints throughout the night; no distress observed or reported.    Pt slept 7 hours.

## 2017-07-06 NOTE — Behavioral Health Treatment Team (Signed)
Social work process group    Patient did not attend social work process group; nor was patient invited due to violent, unpredictable behavior.
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
## 2017-07-06 NOTE — Behavioral Health Treatment Team (Cosign Needed)
GROUP THERAPY PROGRESS NOTE    The patient Joan Molina a 37 y.o. female is participating in Creative Expression Group.     Group time: 1 hour    Personal goal for participation: To concentrate on selected task    Goal orientation: social    Group therapy participation: active    Therapeutic interventions reviewed and discussed: Crafts, games, music    Impression of participation: The patient was attentive-required prompting    Audelia HivesBeverly S Baker  07/06/2017  5:17 PM

## 2017-07-06 NOTE — Behavioral Health Treatment Team (Signed)
PSYCHIATRIC PROGRESS NOTE       Patient Name  Joan Molina   Date of Birth 06/03/1980   CSN [id number]   Medical Record Number  [id number]      Age  37 y.o.   PCP None   Admit date:  07/01/2017    Room Number  313/02  @ [facility]   Date of Service  07/06/2017         E & M PROGRESS NOTE:       HISTORY       CC:  "I am in a hopeless situation"HISTORY OF PRESENT ILLNESS/INTERVAL HISTORY:  (reviewed/updated 07/06/2017).  per initial evaluation: Patient was not cooperative. Gave only scanty information. Reports she has become hopeless because she is homeless. Was recently in [place] and says she was discharged to a "crack house."  Feels "alienated and hopeless." Talks about self in the 3rd person. Appears to be paranoid. Declines further evaluation.     Per BSMART note, Patient is a 37 year old female seen face to face in the ER.  She came to the ER reporting suicidal ideation without a plan.  She reported she was psychiatrically admitted to [facility] in March and upon discharge was "sent to a crack house" to live so she left.  She continuously spoke about being "tired of reaching out for help and being turned away."  She reported she has been in [place] since 2016.  She has been homeless a good part of this time.  She was very focused on not wanting to be around "crack heads" and having issues with housing being around people using crack.  She reported she feels hopeless.  She denied homicidal ideation or symptoms of psychosis.  However, she was observed by nursing staff apparently responding to internal stimuli.  Patient appears to be manic, as she is very irritable and at times made statement that were difficult to follow.  She reported she had a stroke in 2018 and since then "anything I take can upset my brain," so she hasn't taken medication consistently since then.  She reported she has a history of suicide attempts but would not provide any information about what she did or when  they occurred.  She talked about [facility] not helping her, so [facility] was contacted for collateral.  They reported she has a history of Schizoaffective Disorder Bipolar Type and was closed to services in May 2018 due to treatment noncompliance.  Patient is requesting admission.  Since we have no information about her baseline and level of irritability on a day to day basis, she will be admitted for treatment.    4/22- patient has been labile, responding to internal stimuli, provides a grossly disorganized narrative. Patient continues to be labile, irritable, misperceiving interactions with staff, refusing medications. Patient denies, then endorses SI and PI. Patient informed that treatment would consist of mood stabilizer and antipsychotics, and began to scream about not needing medication, and insisting she has a seizure disorder and not psychosis. Refused PRNs, was redirectable.  4/23 - Pt was TDO'd by crisis yesterday. Later came to RN station, became agitated without any apparent provocation, demanded medications but refused antispsychotic and mood stabilizer. Patient placed into seclusion due to agitation, got Geodon 20 mg IM. Patient states that she had 'liver problems' due to Depakote in the past, but is amenable to taking Trileptal.  4/24 - patient has been visible, irritable, continues to be labile and grossly manic. Patient given Geodon IM due to increased  agitation, slept 7 hours. Patient refused Hinda Glatternvega stating she does not need it. Court order explained, does not voice understanding. Patient noted to be involved in a verbal altercation with another patient, due to other patient's making racial statements.  ??      SIDE EFFECTS: (reviewed/updated 07/06/2017)  None reported or admitted to.     ALLERGIES:(reviewed/updated 07/06/2017)  Allergies   Allergen Reactions   ??? Penicillins Hives and Itching      MEDICATIONS PRIOR TO ADMISSION:(reviewed/updated 07/06/2017)  Medications Prior to Admission   Medication Sig    ??? ziprasidone (GEODON) 80 mg capsule Take 160 mg by mouth nightly.   ??? benztropine (COGENTIN) 2 mg tablet Take 1 mg by mouth two (2) times a day.   ??? OXcarbaxepine (TRILEPTAL) 600 mg tablet Take 600 mg by mouth two (2) times a day.   ??? lisinopril (PRINIVIL, ZESTRIL) 10 mg tablet Take 10 mg by mouth daily.   ??? esomeprazole (NEXIUM) 40 mg capsule Take 40 mg by mouth daily.   ??? acetaminophen (TYLENOL) 325 mg tablet Take 2 Tabs by mouth every four (4) hours as needed for Pain.   ??? naproxen (NAPROSYN) 375 mg tablet Take 1 Tab by mouth two (2) times daily (with meals).      PAST MEDICAL HISTORY: Past medical history from the initial psychiatric evaluation has been reviewed (reviewed/updated 07/06/2017) with no additional updates (I asked patient and no additional past medical history provided).   Past Medical History:   Diagnosis Date   ??? Congestive heart failure (CHF) (HCC) 2011   ??? Hypertension    ??? Psychiatric disorder     Bipolar   ??? Stroke [facility](HCC)    History reviewed. No pertinent surgical history.   SOCIAL HISTORY: Social history from the initial psychiatric evaluation has been reviewed (reviewed/updated 07/06/2017) with no additional updates (I asked patient and no additional social history provided).   Social History     Socioeconomic History   ??? Marital status: SINGLE     Spouse name: Not on file   ??? Number of children: Not on file   ??? Years of education: Not on file   ??? Highest education level: Not on file   Occupational History   ??? Not on file   Social Needs   ??? Financial resource strain: Not on file   ??? Food insecurity:     Worry: Not on file     Inability: Not on file   ??? Transportation needs:     Medical: Not on file     Non-medical: Not on file   Tobacco Use   ??? Smoking status: Current Every Day Smoker     Packs/day: 1.00     Years: 14.00     Pack years: 14.00   ??? Smokeless tobacco: Never Used   Substance and Sexual Activity   ??? Alcohol use: No   ??? Drug use: Yes     Types: Marijuana    ??? Sexual activity: Not on file   Lifestyle   ??? Physical activity:     Days per week: Not on file     Minutes per session: Not on file   ??? Stress: Not on file   Relationships   ??? Social connections:     Talks on phone: Not on file     Gets together: Not on file     Attends religious service: Not on file     Active member of club or organization: Not on file  Attends meetings of clubs or organizations: Not on file     Relationship status: Not on file   ??? Intimate partner violence:     Fear of current or ex partner: Not on file     Emotionally abused: Not on file     Physically abused: Not on file     Forced sexual activity: Not on file   Other Topics Concern   ??? Not on file   Social History Narrative   ??? Not on file      FAMILY HISTORY: Family history from the initial psychiatric evaluation has been reviewed (reviewed/updated 07/06/2017) with no additional updates (I asked patient and no additional family history provided). History reviewed. No pertinent family history.    REVIEW OF SYSTEMS: (reviewed/updated 07/06/2017)  Appetite:no change from normal   Sleep: poor with DIMS (difficulty initiating & maintaining sleep)   All other Review of Systems: Negative except per HPI         MENTAL STATUS EXAM & VITALS     MENTAL STATUS EXAM (MSE):    MSE FINDINGS ARE WITHIN NORMAL LIMITS (WNL) UNLESS OTHERWISE STATED BELOW. ( ALL OF THE BELOW CATEGORIES OF THE MSE HAVE BEEN REVIEWED (reviewed 07/06/2017) AND UPDATED AS DEEMED APPROPRIATE )  General Presentation age appropriate, uncooperative   Orientation not oriented to situation   Vital Signs  See below (reviewed 07/06/2017); Vital Signs (BP, Pulse, & Temp) are within normal limits if not listed below.   Gait and Station Stable/steady, no ataxia   Musculoskeletal System No extrapyramidal symptoms (EPS); no abnormal muscular movements or Tardive Dyskinesia (TD); muscle strength and tone are within normal limits   Language No aphasia or dysarthria    Speech:  normal volume and pressured   Thought Processes illogical; normal rate of thoughts; poor abstract reasoning/computation   Thought Associations loose associations and tangential   Thought Content paranoid delusions, grandiose delusions and preoccupations   Suicidal Ideations contracts for safety   Homicidal Ideations plan   Mood:  hostile  and labile    Affect:  full range, increased in intensity and irritable   Memory recent  intact   Memory remote:  intact   Concentration/Attention:  intact   Fund of Knowledge average   Insight:  limited   Reliability poor   Judgment:  poor          VITALS:     Patient Vitals for the past 24 hrs:   Temp Pulse Resp BP SpO2   07/06/17 0815 ??? 78 ??? 93/47 100 %   07/05/17 2003 98.2 ??F (36.8 ??C) 95 16 124/81 100 %     Wt Readings from Last 3 Encounters:   07/01/17 107.5 kg (237 lb)   12/12/14 108.9 kg (240 lb)     Temp Readings from Last 3 Encounters:   07/05/17 98.2 ??F (36.8 ??C)   12/12/14 98.7 ??F (37.1 ??C)     BP Readings from Last 3 Encounters:   07/06/17 93/47   12/12/14 (!) 138/97     Pulse Readings from Last 3 Encounters:   07/06/17 78   12/12/14 79            DATA     LABORATORY DATA:(reviewed/updated 07/06/2017)  No results found for this or any previous visit (from the past 24 hour(s)).  No results found for: VALF2, VALAC, VALP, VALPR, DS6, CRBAM, CRBAMP, CARB2, XCRBAM  No results found for: LITHM   RADIOLOGY REPORTS:(reviewed/updated 07/06/2017)  No results found.  MEDICATIONS     ALL MEDICATIONS:   Current Facility-Administered Medications   Medication Dose Route Frequency   ??? ziprasidone (GEODON) 20 mg in sterile water (preservative free) 1 mL injection  20 mg IntraMUSCular BID PRN   ??? clonazePAM (KlonoPIN) tablet 1 mg  1 mg Oral BID   ??? OXcarbazepine (TRILEPTAL) tablet 300 mg  300 mg Oral BID   ??? paliperidone (INVEGA) SR tablet 6 mg  6 mg Oral DAILY   ??? OLANZapine (ZyPREXA) tablet 5 mg  5 mg Oral Q4H PRN   ??? melatonin tablet 6 mg  6 mg Oral QHS    ??? vitamin B complex capsule 1 Cap  1 Cap Oral DAILY   ??? benztropine (COGENTIN) tablet 2 mg  2 mg Oral BID PRN   ??? benztropine (COGENTIN) injection 2 mg  2 mg IntraMUSCular BID PRN   ??? LORazepam (ATIVAN) injection 2 mg  2 mg IntraMUSCular Q4H PRN   ??? acetaminophen (TYLENOL) tablet 650 mg  650 mg Oral Q4H PRN   ??? ibuprofen (MOTRIN) tablet 400 mg  400 mg Oral Q8H PRN   ??? magnesium hydroxide (MILK OF MAGNESIA) 400 mg/5 mL oral suspension 30 mL  30 mL Oral DAILY PRN   ??? nicotine (NICODERM CQ) 21 mg/24 hr patch 1 Patch  1 Patch TransDERmal DAILY PRN   ??? hydrOXYzine HCl (ATARAX) tablet 50 mg  50 mg Oral Q6H PRN   ??? traZODone (DESYREL) tablet 50 mg  50 mg Oral QHS PRN   ??? lisinopril (PRINIVIL, ZESTRIL) tablet 10 mg  10 mg Oral DAILY   ??? pantoprazole (PROTONIX) tablet 40 mg  40 mg Oral DAILY      SCHEDULED MEDICATIONS:   Current Facility-Administered Medications   Medication Dose Route Frequency   ??? clonazePAM (KlonoPIN) tablet 1 mg  1 mg Oral BID   ??? OXcarbazepine (TRILEPTAL) tablet 300 mg  300 mg Oral BID   ??? paliperidone (INVEGA) SR tablet 6 mg  6 mg Oral DAILY   ??? melatonin tablet 6 mg  6 mg Oral QHS   ??? vitamin B complex capsule 1 Cap  1 Cap Oral DAILY   ??? lisinopril (PRINIVIL, ZESTRIL) tablet 10 mg  10 mg Oral DAILY   ??? pantoprazole (PROTONIX) tablet 40 mg  40 mg Oral DAILY        ASSESSMENT & PLAN     DIAGNOSES REQUIRING ACTIVE TREATMENT AND MONITORING: (reviewed/updated 07/06/2017)  Patient Active Hospital Problem List:   Bipolar disorder (HCC) (07/01/2017)    Assessment: patient grossly manic, does not want medications. Patient has been visible, having ongoing paranoid delusions and screaming at patients. Patient is now court ordered for medication, will initiate as she has been non-compliant with standing antipsychotics.   COURT ORDERED MEDICATION:  Invega 6 mg *OR* Haldol 5 mg IM QDAY for psychosis  Klonopin 1 mg *OR* Ativan 1 mg BID for agitation      NON COURT ORDERED MEDICATION   - INCREASE Trileptal to 600 mg Q12H for mania  - START Calcium cabonate    In summary, Joan Molina, is a 37 y.o.  female who presents with a severe exacerbation of the principal diagnosis of bipolar mania with psychotic features.    Patient's condition is not improving.    Patient requires continued inpatient hospitalization for further stabilization, safety monitoring and medication management.  I will continue to coordinate the provision of individual, milieu, occupational, group, and substance abuse therapies to address target symptoms/diagnoses as deemed appropriate  for the individual patient.  A coordinated, multidisplinary treatment team round was conducted with the patient (this team consists of the nurse, psychiatric unit pharmcist, Administrator).     Complete current electronic health record for patient has been reviewed today including consultant notes, ancillary staff notes, nurses and psychiatric tech notes.    Suicide risk assessment completed and patient deemed to be of low risk for suicide at this time.     The following regarding medications was addressed during rounds with patient:   the risks and benefits of the proposed medication. The patient was given the opportunity to ask questions. Informed consent given to the use of the above medications. Will continue to adjust psychiatric and non-psychiatric medications (see above "medication" section and orders section for details) as deemed appropriate & based upon diagnoses and response to treatment.     I will continue to order blood tests/labs and diagnostic tests as deemed appropriate and review results as they become available (see orders for details and above listed lab/test results).    I will order psychiatric records from previous psych hospitals to further elucidate the nature of patient's psychopathology and review once available.    I will gather additional collateral information from friends, family and  o/p treatment team to further elucidate the nature of patient's psychopathology and baselline level of psychiatric functioning.         I certify that this patient's inpatient psychiatric hospital services furnished since the previous certification were, and continue to be, required for treatment that could reasonably be expected to improve the patient's condition, or for diagnostic study, and that the patient continues to need, on a daily basis, active treatment furnished directly by or requiring the supervision of inpatient psychiatric facility personnel. In addition, the hospital records show that services furnished were intensive treatment services, admission or related services, or equivalent services.    EXPECTED DISCHARGE DATE/DAY: TBD     DISPOSITION: Shelter       Signed By:   Hiram Gash, MD  07/06/2017

## 2017-07-07 ENCOUNTER — Inpatient Hospital Stay: Admit: 2017-07-07 | Attending: Psychiatry

## 2017-07-07 MED ORDER — MELATONIN 3 MG TAB
3 mg | Freq: Every evening | ORAL | Status: DC | PRN
Start: 2017-07-07 — End: 2017-07-11
  Administered 2017-07-08 – 2017-07-11 (×4): via ORAL

## 2017-07-07 MED ORDER — SENNOSIDES 8.6 MG TAB
8.6 mg | Freq: Every day | ORAL | Status: DC
Start: 2017-07-07 — End: 2017-07-07

## 2017-07-07 MED ORDER — SENNOSIDES 8.6 MG TAB
8.6 mg | Freq: Every day | ORAL | Status: DC | PRN
Start: 2017-07-07 — End: 2017-07-11

## 2017-07-07 MED ORDER — ZIPRASIDONE 40 MG CAP
40 mg | Freq: Two times a day (BID) | ORAL | Status: DC
Start: 2017-07-07 — End: 2017-07-10
  Administered 2017-07-07 – 2017-07-10 (×7): via ORAL

## 2017-07-07 MED FILL — SENNA 8.6 MG TABLET: 8.6 mg | ORAL | Qty: 1

## 2017-07-07 MED FILL — VITAMINS B COMPLEX CAPSULE: ORAL | Qty: 1

## 2017-07-07 MED FILL — BENZTROPINE 1 MG TAB: 1 mg | ORAL | Qty: 1

## 2017-07-07 MED FILL — CLONAZEPAM 1 MG TAB: 1 mg | ORAL | Qty: 1

## 2017-07-07 MED FILL — CALCIUM CARBONATE 200 MG (500 MG) CHEWABLE TAB: 200 mg calcium (500 mg) | ORAL | Qty: 1

## 2017-07-07 MED FILL — OXCARBAZEPINE 150 MG TAB: 150 mg | ORAL | Qty: 4

## 2017-07-07 MED FILL — ZIPRASIDONE 20 MG CAP: 20 mg | ORAL | Qty: 1

## 2017-07-07 MED FILL — NICOTINE 21 MG/24 HR DAILY PATCH: 21 mg/24 hr | TRANSDERMAL | Qty: 1

## 2017-07-07 MED FILL — ZIPRASIDONE 40 MG CAP: 40 mg | ORAL | Qty: 1

## 2017-07-07 NOTE — Behavioral Health Treatment Team (Cosign Needed)
GROUP THERAPY PROGRESS NOTE    The patient Joan Molina a 37 y.o. female is participating in Creative Expression Group.     Group time: 1 hour    Personal goal for participation: To concentrate on selected task    Goal orientation: social    Group therapy participation: active    Therapeutic interventions reviewed and discussed: Crafts, games, music    Impression of participation: The patient was attentive.    Adria DevonBeverly S Baker  07/07/2017  6:04 PM

## 2017-07-07 NOTE — Behavioral Health Treatment Team (Signed)
Social work process group    Patient did not attend social work process group.

## 2017-07-07 NOTE — Behavioral Health Treatment Team (Cosign Needed)
GROUP THERAPY PROGRESS NOTE    The patient Joan Molina a 37 y.o. female is participating in Coping Skills Group.     Group time: 45 minutes    Personal goal for participation: To develop a personal plan for success    Goal orientation:  personal    Group therapy participation: minimal    Therapeutic interventions reviewed and discussed: positive coping strategies/goals    Impression of participation:  The patient was present-arrived late    Audelia HivesBeverly S Baker  07/07/2017  5:36 PM

## 2017-07-07 NOTE — Behavioral Health Treatment Team (Signed)
Patient irritable during early part of shift.  Patient took scheduled morning medication and irritability decreased throughout rest of shift.  Patient seen in dayroom reading paper and listening to music. Patient focused on discharge and finding housing when discharged.    Will continue to monitor for safety.

## 2017-07-07 NOTE — Behavioral Health Treatment Team (Signed)
PSYCHIATRIC PROGRESS NOTE       Joan Molina Name  Joan Molina   Date of Birth 1980/12/14   CSN [id number]   Medical Record Number  [id number]      Age  37 y.o.   PCP None   Admit date:  07/01/2017    Room Number  313/02  @ [facility]   Date of Service  07/07/2017         E & M PROGRESS NOTE:       HISTORY       CC:  "I am in a hopeless situation"HISTORY OF PRESENT ILLNESS/INTERVAL HISTORY:  (reviewed/updated 07/07/2017).  per initial evaluation: Joan Molina was not cooperative. Gave only scanty information. Reports she has become hopeless because she is homeless. Was recently in [place] and says she was discharged to a "crack house."  Feels "alienated and hopeless." Talks about self in the 3rd person. Appears to be paranoid. Declines further evaluation.     Per [facility] note, Joan Molina is a 37 year old female seen face to face in the ER.  She came to the ER reporting suicidal ideation without a plan.  She reported she was psychiatrically admitted to [facility] in March and upon discharge was "sent to a crack house" to live so she left.  She continuously spoke about being "tired of reaching out for help and being turned away."  She reported she has been in [place] since 2016.  She has been homeless a good part of this time.  She was very focused on not wanting to be around "crack heads" and having issues with housing being around people using crack.  She reported she feels hopeless.  She denied homicidal ideation or symptoms of psychosis.  However, she was observed by nursing staff apparently responding to internal stimuli.  Joan Molina appears to be manic, as she is very irritable and at times made statement that were difficult to follow.  She reported she had a stroke in 2018 and since then "anything I take can upset my brain," so she hasn't taken medication consistently since then.  She reported she has a history of suicide attempts but would not provide any information about what she did or when  they occurred.  She talked about [facility] not helping her, so [facility] was contacted for collateral.  They reported she has a history of Schizoaffective Disorder Bipolar Type and was closed to services in May 2018 due to treatment noncompliance.  Joan Molina is requesting admission.  Since we have no information about her baseline and level of irritability on a day to day basis, she will be admitted for treatment.    4/22- Joan Molina has been labile, responding to internal stimuli, provides a grossly disorganized narrative. Joan Molina continues to be labile, irritable, misperceiving interactions with staff, refusing medications. Joan Molina denies, then endorses SI and PI. Joan Molina informed that treatment would consist of mood stabilizer and antipsychotics, and began to scream about not needing medication, and insisting she has a seizure disorder and not psychosis. Refused PRNs, was redirectable.  4/23 - Pt was TDO'd by crisis yesterday. Later came to RN station, became agitated without any apparent provocation, demanded medications but refused antispsychotic and mood stabilizer. Joan Molina placed into seclusion due to agitation, got Geodon 20 mg IM. Joan Molina states that she had 'liver problems' due to Depakote in the past, but is amenable to taking Trileptal.  4/24 - Joan Molina has been visible, irritable, continues to be labile and grossly manic. Joan Molina given Geodon IM due to increased  agitation, slept 7 hours. Joan Molina refused Hinda Glatter stating she does not need it. Court order explained, does not voice understanding. Joan Molina noted to be involved in a verbal altercation with another Joan Molina, due to other Joan Molina's making racial statements.  4/25- Joan Molina less agitated, still grossly manic, irritable; had an outburst this morning but was redirected. Joan Molina has been medication compliant with much encouragement, no PRNS given. Slept 8 hours. SW working with pt to obtain placement to ALF upon discharge.  ??       SIDE EFFECTS: (reviewed/updated 07/07/2017)  None reported or admitted to.     ALLERGIES:(reviewed/updated 07/07/2017)  Allergies   Allergen Reactions   ??? Penicillins Hives and Itching      MEDICATIONS PRIOR TO ADMISSION:(reviewed/updated 07/07/2017)  Medications Prior to Admission   Medication Sig   ??? ziprasidone (GEODON) 80 mg capsule Take 160 mg by mouth nightly.   ??? benztropine (COGENTIN) 2 mg tablet Take 1 mg by mouth two (2) times a day.   ??? OXcarbaxepine (TRILEPTAL) 600 mg tablet Take 600 mg by mouth two (2) times a day.   ??? lisinopril (PRINIVIL, ZESTRIL) 10 mg tablet Take 10 mg by mouth daily.   ??? esomeprazole (NEXIUM) 40 mg capsule Take 40 mg by mouth daily.   ??? acetaminophen (TYLENOL) 325 mg tablet Take 2 Tabs by mouth every four (4) hours as needed for Pain.   ??? naproxen (NAPROSYN) 375 mg tablet Take 1 Tab by mouth two (2) times daily (with meals).      PAST MEDICAL HISTORY: Past medical history from the initial psychiatric evaluation has been reviewed (reviewed/updated 07/07/2017) with no additional updates (I asked Joan Molina and no additional past medical history provided).   Past Medical History:   Diagnosis Date   ??? Congestive heart failure (CHF) (HCC) 2011   ??? Hypertension    ??? Psychiatric disorder     Bipolar   ??? Stroke [facility])    History reviewed. No pertinent surgical history.   SOCIAL HISTORY: Social history from the initial psychiatric evaluation has been reviewed (reviewed/updated 07/07/2017) with no additional updates (I asked Joan Molina and no additional social history provided).   Social History     Socioeconomic History   ??? Marital status: SINGLE     Spouse name: Not on file   ??? Number of children: Not on file   ??? Years of education: Not on file   ??? Highest education level: Not on file   Occupational History   ??? Not on file   Social Needs   ??? Financial resource strain: Not on file   ??? Food insecurity:     Worry: Not on file     Inability: Not on file   ??? Transportation needs:     Medical: Not on file      Non-medical: Not on file   Tobacco Use   ??? Smoking status: Current Every Day Smoker     Packs/day: 1.00     Years: 14.00     Pack years: 14.00   ??? Smokeless tobacco: Never Used   Substance and Sexual Activity   ??? Alcohol use: No   ??? Drug use: Yes     Types: Marijuana   ??? Sexual activity: Not on file   Lifestyle   ??? Physical activity:     Days per week: Not on file     Minutes per session: Not on file   ??? Stress: Not on file   Relationships   ??? Social connections:  Talks on phone: Not on file     Gets together: Not on file     Attends religious service: Not on file     Active member of club or organization: Not on file     Attends meetings of clubs or organizations: Not on file     Relationship status: Not on file   ??? Intimate partner violence:     Fear of current or ex partner: Not on file     Emotionally abused: Not on file     Physically abused: Not on file     Forced sexual activity: Not on file   Other Topics Concern   ??? Not on file   Social History Narrative   ??? Not on file      FAMILY HISTORY: Family history from the initial psychiatric evaluation has been reviewed (reviewed/updated 07/07/2017) with no additional updates (I asked Joan Molina and no additional family history provided). History reviewed. No pertinent family history.    REVIEW OF SYSTEMS: (reviewed/updated 07/07/2017)  Appetite:no change from normal   Sleep: poor with DIMS (difficulty initiating & maintaining sleep)   All other Review of Systems: Negative except per HPI         MENTAL STATUS EXAM & VITALS     MENTAL STATUS EXAM (MSE):    MSE FINDINGS ARE WITHIN NORMAL LIMITS (WNL) UNLESS OTHERWISE STATED BELOW. ( ALL OF THE BELOW CATEGORIES OF THE MSE HAVE BEEN REVIEWED (reviewed 07/07/2017) AND UPDATED AS DEEMED APPROPRIATE )  General Presentation age appropriate, uncooperative   Orientation not oriented to situation   Vital Signs  See below (reviewed 07/07/2017); Vital Signs (BP, Pulse, & Temp) are within normal limits if not listed below.    Gait and Station Stable/steady, no ataxia   Musculoskeletal System No extrapyramidal symptoms (EPS); no abnormal muscular movements or Tardive Dyskinesia (TD); muscle strength and tone are within normal limits   Language No aphasia or dysarthria   Speech:  normal volume and pressured   Thought Processes coherent; normal rate of thoughts; poor abstract reasoning/computation   Thought Associations tangential   Thought Content grandiose delusions and preoccupations   Suicidal Ideations contracts for safety   Homicidal Ideations plan   Mood:  irritable and labile    Affect:  full range, increased in intensity and irritable   Memory recent  intact   Memory remote:  intact   Concentration/Attention:  intact   Fund of Knowledge average   Insight:  limited   Reliability poor   Judgment:  poor          VITALS:     Joan Molina Vitals for the past 24 hrs:   Temp Pulse Resp BP SpO2   07/07/17 0747 98.5 ??F (36.9 ??C) ??? 16 132/79 100 %   07/06/17 1136 98 ??F (36.7 ??C) 80 16 122/72 98 %     Wt Readings from Last 3 Encounters:   07/01/17 107.5 kg (237 lb)   12/12/14 108.9 kg (240 lb)     Temp Readings from Last 3 Encounters:   07/07/17 98.5 ??F (36.9 ??C)   12/12/14 98.7 ??F (37.1 ??C)     BP Readings from Last 3 Encounters:   07/07/17 132/79   12/12/14 (!) 138/97     Pulse Readings from Last 3 Encounters:   07/06/17 80   12/12/14 79            DATA     LABORATORY DATA:(reviewed/updated 07/07/2017)  No results found for this or any  previous visit (from the past 24 hour(s)).  No results found for: VALF2, VALAC, VALP, VALPR, DS6, CRBAM, CRBAMP, CARB2, XCRBAM  No results found for: LITHM   RADIOLOGY REPORTS:(reviewed/updated 07/07/2017)  No results found.       MEDICATIONS     ALL MEDICATIONS:   Current Facility-Administered Medications   Medication Dose Route Frequency   ??? ziprasidone (GEODON) capsule 40 mg  40 mg Oral BID WITH MEALS   ??? senna (SENOKOT) tablet 8.6 mg  1 Tab Oral DAILY PRN   ??? melatonin tablet 6 mg  6 mg Oral QHS PRN    ??? OXcarbazepine (TRILEPTAL) tablet 600 mg  600 mg Oral BID   ??? calcium carbonate (TUMS) chewable tablet 200 mg [elemental]  200 mg Oral BID WITH MEALS   ??? benztropine (COGENTIN) tablet 1 mg  1 mg Oral BID   ??? ziprasidone (GEODON) 20 mg in sterile water (preservative free) 1 mL injection  20 mg IntraMUSCular BID PRN   ??? clonazePAM (KlonoPIN) tablet 1 mg  1 mg Oral BID   ??? OLANZapine (ZyPREXA) tablet 5 mg  5 mg Oral Q4H PRN   ??? vitamin B complex capsule 1 Cap  1 Cap Oral DAILY   ??? benztropine (COGENTIN) tablet 2 mg  2 mg Oral BID PRN   ??? benztropine (COGENTIN) injection 2 mg  2 mg IntraMUSCular BID PRN   ??? LORazepam (ATIVAN) injection 2 mg  2 mg IntraMUSCular Q4H PRN   ??? acetaminophen (TYLENOL) tablet 650 mg  650 mg Oral Q4H PRN   ??? ibuprofen (MOTRIN) tablet 400 mg  400 mg Oral Q8H PRN   ??? magnesium hydroxide (MILK OF MAGNESIA) 400 mg/5 mL oral suspension 30 mL  30 mL Oral DAILY PRN   ??? nicotine (NICODERM CQ) 21 mg/24 hr patch 1 Patch  1 Patch TransDERmal DAILY PRN   ??? hydrOXYzine HCl (ATARAX) tablet 50 mg  50 mg Oral Q6H PRN   ??? lisinopril (PRINIVIL, ZESTRIL) tablet 10 mg  10 mg Oral DAILY   ??? pantoprazole (PROTONIX) tablet 40 mg  40 mg Oral DAILY      SCHEDULED MEDICATIONS:   Current Facility-Administered Medications   Medication Dose Route Frequency   ??? ziprasidone (GEODON) capsule 40 mg  40 mg Oral BID WITH MEALS   ??? OXcarbazepine (TRILEPTAL) tablet 600 mg  600 mg Oral BID   ??? calcium carbonate (TUMS) chewable tablet 200 mg [elemental]  200 mg Oral BID WITH MEALS   ??? benztropine (COGENTIN) tablet 1 mg  1 mg Oral BID   ??? clonazePAM (KlonoPIN) tablet 1 mg  1 mg Oral BID   ??? vitamin B complex capsule 1 Cap  1 Cap Oral DAILY   ??? lisinopril (PRINIVIL, ZESTRIL) tablet 10 mg  10 mg Oral DAILY   ??? pantoprazole (PROTONIX) tablet 40 mg  40 mg Oral DAILY        ASSESSMENT & PLAN     DIAGNOSES REQUIRING ACTIVE TREATMENT AND MONITORING: (reviewed/updated 07/07/2017)  Joan Molina Active Hospital Problem List:    Bipolar disorder (HCC) (07/01/2017)    Assessment: Joan Molina grossly manic, does not want medications. Joan Molina has been visible, having ongoing paranoid delusions and screaming at patients. Joan Molina is now court ordered for medication, will initiate as she has been non-compliant with standing antipsychotics.   COURT ORDERED MEDICATION:  Klonopin 1 mg *OR* Ativan 1 mg BID for agitation      NON COURT ORDERED MEDICATION  - INCREASE Geodon to 40 mg BID for  psychosis  - CONTINUE Trileptal 600 mg Q12H for mania  - CHANGE Melatonin to 6 mg QHS PRN insomnia    In summary, Joan Molina, is a 37 y.o.  female who presents with a severe exacerbation of the principal diagnosis of bipolar mania with psychotic features.    Joan Molina's condition is improving.    Joan Molina requires continued inpatient hospitalization for further stabilization, safety monitoring and medication management.  I will continue to coordinate the provision of individual, milieu, occupational, group, and substance abuse therapies to address target symptoms/diagnoses as deemed appropriate for the individual Joan Molina.  A coordinated, multidisplinary treatment team round was conducted with the Joan Molina (this team consists of the nurse, psychiatric unit pharmcist, Administrator).     Complete current electronic health record for Joan Molina has been reviewed today including consultant notes, ancillary staff notes, nurses and psychiatric tech notes.    Suicide risk assessment completed and Joan Molina deemed to be of low risk for suicide at this time.     The following regarding medications was addressed during rounds with Joan Molina:   the risks and benefits of the proposed medication. The Joan Molina was given the opportunity to ask questions. Informed consent given to the use of the above medications. Will continue to adjust psychiatric and non-psychiatric medications (see above "medication" section and orders section for  details) as deemed appropriate & based upon diagnoses and response to treatment.     I will continue to order blood tests/labs and diagnostic tests as deemed appropriate and review results as they become available (see orders for details and above listed lab/test results).    I will order psychiatric records from previous psych hospitals to further elucidate the nature of Joan Molina's psychopathology and review once available.    I will gather additional collateral information from friends, family and o/p treatment team to further elucidate the nature of Joan Molina's psychopathology and baselline level of psychiatric functioning.         I certify that this Joan Molina's inpatient psychiatric hospital services furnished since the previous certification were, and continue to be, required for treatment that could reasonably be expected to improve the Joan Molina's condition, or for diagnostic study, and that the Joan Molina continues to need, on a daily basis, active treatment furnished directly by or requiring the supervision of inpatient psychiatric facility personnel. In addition, the hospital records show that services furnished were intensive treatment services, admission or related services, or equivalent services.    EXPECTED DISCHARGE DATE/DAY: 07/11/2017     DISPOSITION: Residence       Signed By:   Hiram Gash, MD  07/07/2017

## 2017-07-07 NOTE — Behavioral Health Treatment Team (Signed)
Pt isolates in room. Pt refused shift assessment. No behavioral concern observed in pt during the shift. Pt observed resting in bed with eyes closed, RR even and unlabored. Will continue to assess and support pt for health and safety.        Pt slept for approx 8 hours this shift.

## 2017-07-07 NOTE — Progress Notes (Signed)
Patient compliant with medications.

## 2017-07-08 MED ORDER — CLONAZEPAM 0.5 MG TAB
0.5 mg | Freq: Two times a day (BID) | ORAL | Status: DC
Start: 2017-07-08 — End: 2017-07-11
  Administered 2017-07-08 – 2017-07-11 (×6): via ORAL

## 2017-07-08 MED ORDER — NICOTINE 14 MG/24 HR DAILY PATCH
14 mg/24 hr | Freq: Every day | TRANSDERMAL | Status: DC
Start: 2017-07-08 — End: 2017-07-11

## 2017-07-08 MED FILL — CALCIUM CARBONATE 200 MG (500 MG) CHEWABLE TAB: 200 mg calcium (500 mg) | ORAL | Qty: 1

## 2017-07-08 MED FILL — HYDROXYZINE 25 MG TAB: 25 mg | ORAL | Qty: 2

## 2017-07-08 MED FILL — PANTOPRAZOLE 40 MG TAB, DELAYED RELEASE: 40 mg | ORAL | Qty: 1

## 2017-07-08 MED FILL — OLANZAPINE 5 MG TAB: 5 mg | ORAL | Qty: 1

## 2017-07-08 MED FILL — VITAMINS B COMPLEX CAPSULE: ORAL | Qty: 1

## 2017-07-08 MED FILL — OXCARBAZEPINE 150 MG TAB: 150 mg | ORAL | Qty: 4

## 2017-07-08 MED FILL — LISINOPRIL 5 MG TAB: 5 mg | ORAL | Qty: 2

## 2017-07-08 MED FILL — ZIPRASIDONE 40 MG CAP: 40 mg | ORAL | Qty: 1

## 2017-07-08 MED FILL — BENZTROPINE 1 MG TAB: 1 mg | ORAL | Qty: 1

## 2017-07-08 MED FILL — CLONAZEPAM 1 MG TAB: 1 mg | ORAL | Qty: 1

## 2017-07-08 MED FILL — MELATONIN 3 MG TAB: 3 mg | ORAL | Qty: 2

## 2017-07-08 MED FILL — NICOTINE 21 MG/24 HR DAILY PATCH: 21 mg/24 hr | TRANSDERMAL | Qty: 1

## 2017-07-08 MED FILL — SENNA 8.6 MG TABLET: 8.6 mg | ORAL | Qty: 1

## 2017-07-08 MED FILL — CLONAZEPAM 0.5 MG TAB: 0.5 mg | ORAL | Qty: 1

## 2017-07-08 NOTE — Progress Notes (Addendum)
Patient irritable at the start of shift. She denied thoughts of harm to herself or others. Patient denies hallucinations. Patient did appear to be internally preoccupied. Patient took prn Melatonin. She is less irritable this shift. No other problems noted. Continue to monitor.  Patient slept 7 hrs.

## 2017-07-08 NOTE — Progress Notes (Addendum)
Pt is less agitated and irritable. Pt is visible on the unit, med/diet compliant. Pt received prn Melatonin for sleep promotion. Pt remains focused about discharge. Pt observed resting in bed with eyes closed, RR even and unlabored. Will continue to monitor and support pt for health.     Pt slept for 6.75 hours

## 2017-07-08 NOTE — Behavioral Health Treatment Team (Cosign Needed)
GROUP THERAPY PROGRESS NOTE    The patient Joan Molina a 37 y.o. female is participating in Creative Expression Group.     Group time: 1 hour    Personal goal for participation: To concentrate on selected task    Goal orientation: social    Group therapy participation: active    Therapeutic interventions reviewed and discussed: Crafts, games, music    Impression of participation: The patient was attentive.    Adria DevonBeverly S Baker  07/08/2017  6:05 PM

## 2017-07-08 NOTE — Behavioral Health Treatment Team (Cosign Needed)
GROUP THERAPY PROGRESS NOTE    The patient Joan Molina a 37 y.o. female is participating in Coping Skills Group.     Group time: 45 minutes    Personal goal for participation: To participate in healthy relationships bingo game    Goal orientation:  personal    Group therapy participation: active    Therapeutic interventions reviewed and discussed: things pertaining to relationships    Impression of participation:  The patient was attentive.    Audelia Hives  07/08/2017  5:39 PM

## 2017-07-08 NOTE — Behavioral Health Treatment Team (Signed)
PSYCHIATRIC PROGRESS NOTE       Patient Name  Joan Molina   Date of Birth [birth year]   CSN [id number]   Medical Record Number  [id number]      Age  37 y.o.   PCP None   Admit date:  07/01/2017    Room Number  313/02  @ [facility]   Date of Service  07/08/2017         E & M PROGRESS NOTE:       HISTORY       CC:  "I am in a hopeless situation"HISTORY OF PRESENT ILLNESS/INTERVAL HISTORY:  (reviewed/updated 07/08/2017).  per initial evaluation: Patient was not cooperative. Gave only scanty information. Reports she has become hopeless because she is homeless. Was recently in [place] and says she was discharged to a "crack house."  Feels "alienated and hopeless." Talks about self in the 3rd person. Appears to be paranoid. Declines further evaluation.     Per BSMART note, Patient is a 37 year old female seen face to face in the ER.  She came to the ER reporting suicidal ideation without a plan.  She reported she was psychiatrically admitted to [facility] in March and upon discharge was "sent to a crack house" to live so she left.  She continuously spoke about being "tired of reaching out for help and being turned away."  She reported she has been in [place] since 2016.  She has been homeless a good part of this time.  She was very focused on not wanting to be around "crack heads" and having issues with housing being around people using crack.  She reported she feels hopeless.  She denied homicidal ideation or symptoms of psychosis.  However, she was observed by nursing staff apparently responding to internal stimuli.  Patient appears to be manic, as she is very irritable and at times made statement that were difficult to follow.  She reported she had a stroke in 2018 and since then "anything I take can upset my brain," so she hasn't taken medication consistently since then.  She reported she has a history of suicide attempts but would not provide any information about what she did or when  they occurred.  She talked about [facility] not helping her, so [facility] was contacted for collateral.  They reported she has a history of Schizoaffective Disorder Bipolar Type and was closed to services in May 2018 due to treatment noncompliance.  Patient is requesting admission.  Since we have no information about her baseline and level of irritability on a day to day basis, she will be admitted for treatment.    4/22- patient has been labile, responding to internal stimuli, provides a grossly disorganized narrative. Patient continues to be labile, irritable, misperceiving interactions with staff, refusing medications. Patient denies, then endorses SI and PI. Patient informed that treatment would consist of mood stabilizer and antipsychotics, and began to scream about not needing medication, and insisting she has a seizure disorder and not psychosis. Refused PRNs, was redirectable.  4/23 - Pt was TDO'd by crisis yesterday. Later came to RN station, became agitated without any apparent provocation, demanded medications but refused antispsychotic and mood stabilizer. Patient placed into seclusion due to agitation, got Geodon 20 mg IM. Patient states that she had 'liver problems' due to Depakote in the past, but is amenable to taking Trileptal.  4/24 - patient has been visible, irritable, continues to be labile and grossly manic. Patient given Geodon IM due to increased  agitation, slept 7 hours. Patient refused Hinda Glatter stating she does not need it. Court order explained, does not voice understanding. Patient noted to be involved in a verbal altercation with another patient, due to other patient's making racial statements.  4/25- patient less agitated, still grossly manic, irritable; had an outburst this morning but was redirected. Patient has been medication compliant with much encouragement, no PRNS given. Slept 8 hours. SW working with pt to obtain placement to ALF upon discharge.   4/26- patient has been more appropriate, still generally irritable, with intense affect. Has outbursts at random with no provocation. No PRNs given, patient compliant with medication. Patient counseled on coping skills, frustration tolerance, did not voice understanding.  ??      SIDE EFFECTS: (reviewed/updated 07/08/2017)  None reported or admitted to.     ALLERGIES:(reviewed/updated 07/08/2017)  Allergies   Allergen Reactions   ??? Penicillins Hives and Itching      MEDICATIONS PRIOR TO ADMISSION:(reviewed/updated 07/08/2017)  Medications Prior to Admission   Medication Sig   ??? ziprasidone (GEODON) 80 mg capsule Take 160 mg by mouth nightly.   ??? benztropine (COGENTIN) 2 mg tablet Take 1 mg by mouth two (2) times a day.   ??? OXcarbaxepine (TRILEPTAL) 600 mg tablet Take 600 mg by mouth two (2) times a day.   ??? lisinopril (PRINIVIL, ZESTRIL) 10 mg tablet Take 10 mg by mouth daily.   ??? esomeprazole (NEXIUM) 40 mg capsule Take 40 mg by mouth daily.   ??? acetaminophen (TYLENOL) 325 mg tablet Take 2 Tabs by mouth every four (4) hours as needed for Pain.   ??? naproxen (NAPROSYN) 375 mg tablet Take 1 Tab by mouth two (2) times daily (with meals).      PAST MEDICAL HISTORY: Past medical history from the initial psychiatric evaluation has been reviewed (reviewed/updated 07/08/2017) with no additional updates (I asked patient and no additional past medical history provided).   Past Medical History:   Diagnosis Date   ??? Congestive heart failure (CHF) (HCC) 2011   ??? Hypertension    ??? Psychiatric disorder     Bipolar   ??? Stroke [facility])    History reviewed. No pertinent surgical history.   SOCIAL HISTORY: Social history from the initial psychiatric evaluation has been reviewed (reviewed/updated 07/08/2017) with no additional updates (I asked patient and no additional social history provided).   Social History     Socioeconomic History   ??? Marital status: SINGLE     Spouse name: Not on file   ??? Number of children: Not on file    ??? Years of education: Not on file   ??? Highest education level: Not on file   Occupational History   ??? Not on file   Social Needs   ??? Financial resource strain: Not on file   ??? Food insecurity:     Worry: Not on file     Inability: Not on file   ??? Transportation needs:     Medical: Not on file     Non-medical: Not on file   Tobacco Use   ??? Smoking status: Current Every Day Smoker     Packs/day: 1.00     Years: 14.00     Pack years: 14.00   ??? Smokeless tobacco: Never Used   Substance and Sexual Activity   ??? Alcohol use: No   ??? Drug use: Yes     Types: Marijuana   ??? Sexual activity: Not on file   Lifestyle   ???  Physical activity:     Days per week: Not on file     Minutes per session: Not on file   ??? Stress: Not on file   Relationships   ??? Social connections:     Talks on phone: Not on file     Gets together: Not on file     Attends religious service: Not on file     Active member of club or organization: Not on file     Attends meetings of clubs or organizations: Not on file     Relationship status: Not on file   ??? Intimate partner violence:     Fear of current or ex partner: Not on file     Emotionally abused: Not on file     Physically abused: Not on file     Forced sexual activity: Not on file   Other Topics Concern   ??? Not on file   Social History Narrative   ??? Not on file      FAMILY HISTORY: Family history from the initial psychiatric evaluation has been reviewed (reviewed/updated 07/08/2017) with no additional updates (I asked patient and no additional family history provided). History reviewed. No pertinent family history.    REVIEW OF SYSTEMS: (reviewed/updated 07/08/2017)  Appetite:no change from normal   Sleep: poor with DIMS (difficulty initiating & maintaining sleep)   All other Review of Systems: Negative except per HPI         MENTAL STATUS EXAM & VITALS     MENTAL STATUS EXAM (MSE):    MSE FINDINGS ARE WITHIN NORMAL LIMITS (WNL) UNLESS OTHERWISE STATED BELOW.  ( ALL OF THE BELOW CATEGORIES OF THE MSE HAVE BEEN REVIEWED (reviewed 07/08/2017) AND UPDATED AS DEEMED APPROPRIATE )  General Presentation age appropriate, uncooperative   Orientation not oriented to situation   Vital Signs  See below (reviewed 07/08/2017); Vital Signs (BP, Pulse, & Temp) are within normal limits if not listed below.   Gait and Station Stable/steady, no ataxia   Musculoskeletal System No extrapyramidal symptoms (EPS); no abnormal muscular movements or Tardive Dyskinesia (TD); muscle strength and tone are within normal limits   Language No aphasia or dysarthria   Speech:  pressured   Thought Processes coherent; normal rate of thoughts; poor abstract reasoning/computation   Thought Associations goal directed   Thought Content preoccupations   Suicidal Ideations none   Homicidal Ideations plan   Mood:  irritable and labile    Affect:  full range, increased in intensity and irritable   Memory recent  intact   Memory remote:  intact   Concentration/Attention:  intact   Fund of Knowledge average   Insight:  limited   Reliability poor   Judgment:  poor          VITALS:     Patient Vitals for the past 24 hrs:   Temp Pulse Resp BP SpO2   07/08/17 0751 99.1 ??F (37.3 ??C) 78 16 141/80 100 %   07/07/17 1940 98.2 ??F (36.8 ??C) 86 16 110/67 100 %     Wt Readings from Last 3 Encounters:   07/01/17 107.5 kg (237 lb)   12/12/14 108.9 kg (240 lb)     Temp Readings from Last 3 Encounters:   07/08/17 99.1 ??F (37.3 ??C)   12/12/14 98.7 ??F (37.1 ??C)     BP Readings from Last 3 Encounters:   07/08/17 141/80   12/12/14 (!) 138/97     Pulse Readings from Last 3 Encounters:  07/08/17 78   12/12/14 79            DATA     LABORATORY DATA:(reviewed/updated 07/08/2017)  No results found for this or any previous visit (from the past 24 hour(s)).  No results found for: VALF2, VALAC, VALP, VALPR, DS6, CRBAM, CRBAMP, CARB2, XCRBAM  No results found for: LITHM   RADIOLOGY REPORTS:(reviewed/updated 07/08/2017)  No results found.        MEDICATIONS     ALL MEDICATIONS:   Current Facility-Administered Medications   Medication Dose Route Frequency   ??? [START ON 07/09/2017] nicotine (NICODERM CQ) 14 mg/24 hr patch 1 Patch  1 Patch TransDERmal DAILY   ??? clonazePAM (KlonoPIN) tablet 0.5 mg  0.5 mg Oral BID   ??? ziprasidone (GEODON) capsule 40 mg  40 mg Oral BID WITH MEALS   ??? senna (SENOKOT) tablet 8.6 mg  1 Tab Oral DAILY PRN   ??? melatonin tablet 6 mg  6 mg Oral QHS PRN   ??? OXcarbazepine (TRILEPTAL) tablet 600 mg  600 mg Oral BID   ??? calcium carbonate (TUMS) chewable tablet 200 mg [elemental]  200 mg Oral BID WITH MEALS   ??? benztropine (COGENTIN) tablet 1 mg  1 mg Oral BID   ??? ziprasidone (GEODON) 20 mg in sterile water (preservative free) 1 mL injection  20 mg IntraMUSCular BID PRN   ??? OLANZapine (ZyPREXA) tablet 5 mg  5 mg Oral Q4H PRN   ??? vitamin B complex capsule 1 Cap  1 Cap Oral DAILY   ??? benztropine (COGENTIN) tablet 2 mg  2 mg Oral BID PRN   ??? benztropine (COGENTIN) injection 2 mg  2 mg IntraMUSCular BID PRN   ??? LORazepam (ATIVAN) injection 2 mg  2 mg IntraMUSCular Q4H PRN   ??? acetaminophen (TYLENOL) tablet 650 mg  650 mg Oral Q4H PRN   ??? ibuprofen (MOTRIN) tablet 400 mg  400 mg Oral Q8H PRN   ??? hydrOXYzine HCl (ATARAX) tablet 50 mg  50 mg Oral Q6H PRN   ??? lisinopril (PRINIVIL, ZESTRIL) tablet 10 mg  10 mg Oral DAILY   ??? pantoprazole (PROTONIX) tablet 40 mg  40 mg Oral DAILY      SCHEDULED MEDICATIONS:   Current Facility-Administered Medications   Medication Dose Route Frequency   ??? [START ON 07/09/2017] nicotine (NICODERM CQ) 14 mg/24 hr patch 1 Patch  1 Patch TransDERmal DAILY   ??? clonazePAM (KlonoPIN) tablet 0.5 mg  0.5 mg Oral BID   ??? ziprasidone (GEODON) capsule 40 mg  40 mg Oral BID WITH MEALS   ??? OXcarbazepine (TRILEPTAL) tablet 600 mg  600 mg Oral BID   ??? calcium carbonate (TUMS) chewable tablet 200 mg [elemental]  200 mg Oral BID WITH MEALS   ??? benztropine (COGENTIN) tablet 1 mg  1 mg Oral BID    ??? vitamin B complex capsule 1 Cap  1 Cap Oral DAILY   ??? lisinopril (PRINIVIL, ZESTRIL) tablet 10 mg  10 mg Oral DAILY   ??? pantoprazole (PROTONIX) tablet 40 mg  40 mg Oral DAILY        ASSESSMENT & PLAN     DIAGNOSES REQUIRING ACTIVE TREATMENT AND MONITORING: (reviewed/updated 07/08/2017)  Patient Active Hospital Problem List:   Bipolar disorder (HCC) (07/01/2017)    Assessment: patient grossly manic, does not want medications. Patient has been visible, having ongoing paranoid delusions and screaming at patients. Patient is now court ordered for medication, will initiate as she has been non-compliant with standing antipsychotics.  COURT ORDERED MEDICATION:  Klonopin 0.5 mg *OR* Ativan 0.5 mg BID for agitation      NON COURT ORDERED MEDICATION  - CONTINUE Geodon 40 mg BID for psychosis  - CONTINUE Trileptal 600 mg Q12H for mania    In summary, Joan Molina, is a 37 y.o.  female who presents with a severe exacerbation of the principal diagnosis of bipolar mania with psychotic features.    Patient's condition is improving.    Patient requires continued inpatient hospitalization for further stabilization, safety monitoring and medication management.  I will continue to coordinate the provision of individual, milieu, occupational, group, and substance abuse therapies to address target symptoms/diagnoses as deemed appropriate for the individual patient.  A coordinated, multidisplinary treatment team round was conducted with the patient (this team consists of the nurse, psychiatric unit pharmcist, Administrator).     Complete current electronic health record for patient has been reviewed today including consultant notes, ancillary staff notes, nurses and psychiatric tech notes.    Suicide risk assessment completed and patient deemed to be of low risk for suicide at this time.     The following regarding medications was addressed during rounds with patient:    the risks and benefits of the proposed medication. The patient was given the opportunity to ask questions. Informed consent given to the use of the above medications. Will continue to adjust psychiatric and non-psychiatric medications (see above "medication" section and orders section for details) as deemed appropriate & based upon diagnoses and response to treatment.     I will continue to order blood tests/labs and diagnostic tests as deemed appropriate and review results as they become available (see orders for details and above listed lab/test results).    I will order psychiatric records from previous psych hospitals to further elucidate the nature of patient's psychopathology and review once available.    I will gather additional collateral information from friends, family and o/p treatment team to further elucidate the nature of patient's psychopathology and baselline level of psychiatric functioning.         I certify that this patient's inpatient psychiatric hospital services furnished since the previous certification were, and continue to be, required for treatment that could reasonably be expected to improve the patient's condition, or for diagnostic study, and that the patient continues to need, on a daily basis, active treatment furnished directly by or requiring the supervision of inpatient psychiatric facility personnel. In addition, the hospital records show that services furnished were intensive treatment services, admission or related services, or equivalent services.    EXPECTED DISCHARGE DATE/DAY: 07/11/2017     DISPOSITION: Residence       Signed By:   Hiram Gash, MD  07/08/2017

## 2017-07-08 NOTE — Progress Notes (Signed)
Patient isn't visible in the milieu. Not medication and meal compliant. Alert and oriented. Denies SI/HI and denies AH/VH. Will continue to monitor patient status and assess needs.

## 2017-07-08 NOTE — Behavioral Health Treatment Team (Signed)
Pt was seen in treatment team this morning.  Pt is alert and oriented.  Pt denies SI/HI.  Pt's mood is irritable, labile, affect is labile.  Pt's thought process is coherent and goal oriented.  Pt's insight and judgment are limited, reliability is fair.  Social worker completed [facility] and gave pt resources for mental health skill building.  Pt was interviewed by Noreene Filbert from [facility] and accepted into their ALF with scheduled discharge set for Monday, April 29th.  Social work department will continue to coordinate discharge plans.

## 2017-07-08 NOTE — Progress Notes (Signed)
Diet as tolerated.  Pt noted to be meal compliant.  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

## 2017-07-08 NOTE — Behavioral Health Treatment Team (Signed)
GROUP THERAPY PROGRESS NOTE    Joan Molina did not participate in [facility] group.  She was sitting in the dayroom when group was about to begin however when the group leader began to speak she walked out and did not return.    Joan AddisonJennifer Sydow, MA

## 2017-07-09 MED FILL — PANTOPRAZOLE 40 MG TAB, DELAYED RELEASE: 40 mg | ORAL | Qty: 1

## 2017-07-09 MED FILL — MELATONIN 3 MG TAB: 3 mg | ORAL | Qty: 2

## 2017-07-09 MED FILL — BENZTROPINE 1 MG TAB: 1 mg | ORAL | Qty: 1

## 2017-07-09 MED FILL — ZIPRASIDONE 40 MG CAP: 40 mg | ORAL | Qty: 1

## 2017-07-09 MED FILL — OXCARBAZEPINE 150 MG TAB: 150 mg | ORAL | Qty: 4

## 2017-07-09 MED FILL — CALCIUM CARBONATE 200 MG (500 MG) CHEWABLE TAB: 200 mg calcium (500 mg) | ORAL | Qty: 1

## 2017-07-09 MED FILL — CLONAZEPAM 0.5 MG TAB: 0.5 mg | ORAL | Qty: 1

## 2017-07-09 MED FILL — LISINOPRIL 5 MG TAB: 5 mg | ORAL | Qty: 2

## 2017-07-09 MED FILL — VITAMINS B COMPLEX CAPSULE: ORAL | Qty: 1

## 2017-07-09 MED FILL — NICOTINE 14 MG/24 HR DAILY PATCH: 14 mg/24 hr | TRANSDERMAL | Qty: 1

## 2017-07-09 NOTE — Progress Notes (Signed)
Patient is alert and oriented. She got got her shower this morning and I stood by while she shaved. She is med and meal compliant. Focused on discharge. Will continue to monitor patient status and assess needs per [facility] protocol.

## 2017-07-09 NOTE — Behavioral Health Treatment Team (Signed)
Chief Complaint:  I am just resting    Interval History:  Joan Molina is doing better. Says she has not had any AH or VH. No SI or plan. Denies any adverse events from her medications. She remains isolative to her room and was encouraged to attend groups. Says she is looking forward to going home on Monday. Denies any adverse events from her medications.     Mental Status Exam:  Good eye contact  Pleasant and calm  Denies any SI or plan  No Ah or VH  Fair insight    Assessment and Plan:  Joan Molina is much improved and appears to be ready for discharge.   Continue the current medication regimen   Disposition planning to continue.   I certify that this patients inpatient psychiatric hospital services furnished since the previous certification were, and continue to be, required for treatment that could reasonably be expected to improve the patient's condition, or for diagnostic study, and that the patient continues to need, on a daily basis, active treatment furnished directly by or requiring the supervision of inpatient psychiatric facility personnel. In addition, the hospital records show that services furnished were intensive treatment services, admission or related services, or equivalent services.     Current Facility-Administered Medications   Medication Dose Route Frequency   ??? nicotine (NICODERM CQ) 14 mg/24 hr patch 1 Patch  1 Patch TransDERmal DAILY   ??? clonazePAM (KlonoPIN) tablet 0.5 mg  0.5 mg Oral BID   ??? ziprasidone (GEODON) capsule 40 mg  40 mg Oral BID WITH MEALS   ??? OXcarbazepine (TRILEPTAL) tablet 600 mg  600 mg Oral BID   ??? calcium carbonate (TUMS) chewable tablet 200 mg [elemental]  200 mg Oral BID WITH MEALS   ??? benztropine (COGENTIN) tablet 1 mg  1 mg Oral BID   ??? vitamin B complex capsule 1 Cap  1 Cap Oral DAILY   ??? lisinopril (PRINIVIL, ZESTRIL) tablet 10 mg  10 mg Oral DAILY   ??? pantoprazole (PROTONIX) tablet 40 mg  40 mg Oral DAILY       Physical Exam:         Past Medical History:   Past Medical History:   Diagnosis Date   ??? Congestive heart failure (CHF) ([place]) 2011   ??? Hypertension    ??? Psychiatric disorder     Bipolar   ??? Stroke [facility])            Labs:  Lab Results   Component Value Date/Time    WBC 6.7 07/01/2017 08:30 PM    HGB 11.9 07/01/2017 08:30 PM    HCT 37.6 07/01/2017 08:30 PM    PLATELET 300 07/01/2017 08:30 PM    MCV 87.6 07/01/2017 08:30 PM      Lab Results   Component Value Date/Time    Sodium 139 07/01/2017 08:30 PM    Potassium 3.7 07/01/2017 08:30 PM    Chloride 105 07/01/2017 08:30 PM    CO2 25 07/01/2017 08:30 PM    Anion gap 9 07/01/2017 08:30 PM    Glucose 98 07/01/2017 08:30 PM    BUN 7 07/01/2017 08:30 PM    Creatinine 0.79 07/01/2017 08:30 PM    BUN/Creatinine ratio 9 (L) 07/01/2017 08:30 PM    GFR est AA >60 07/01/2017 08:30 PM    GFR est non-AA >60 07/01/2017 08:30 PM    Calcium 8.7 07/01/2017 08:30 PM    Bilirubin, total 0.2 07/01/2017 08:30 PM    AST (SGOT)  21 07/01/2017 08:30 PM    Alk. phosphatase 69 07/01/2017 08:30 PM    Protein, total 7.6 07/01/2017 08:30 PM    Albumin 3.8 07/01/2017 08:30 PM    Globulin 3.8 07/01/2017 08:30 PM    A-G Ratio 1.0 (L) 07/01/2017 08:30 PM    ALT (SGPT) 20 07/01/2017 08:30 PM      Vitals:    07/07/17 1940 07/08/17 0751 07/08/17 2105 07/09/17 0745   BP: 110/67 141/80 145/80 131/83   Pulse: 86 78 82 73   Resp: '16 16 18 18   ' Temp: 98.2 ??F (36.8 ??C) 99.1 ??F (37.3 ??C) 98.8 ??F (37.1 ??C) 98.1 ??F (36.7 ??C)   SpO2: 100% 100% 100% 100%   Weight:       Height:

## 2017-07-09 NOTE — Progress Notes (Addendum)
Patient was observed walking up and down the hallway at the start of shift. Patient was pleasant and calm. She denies any issues at this time. Patient was sitting in the day room watching television. No verbal aggression noted. No other complaints voices. Continue to monitor.  Patient slept 7 hrs.

## 2017-07-10 MED FILL — CLONAZEPAM 0.5 MG TAB: 0.5 mg | ORAL | Qty: 1

## 2017-07-10 MED FILL — BENZTROPINE 1 MG TAB: 1 mg | ORAL | Qty: 1

## 2017-07-10 MED FILL — CALCIUM CARBONATE 200 MG (500 MG) CHEWABLE TAB: 200 mg calcium (500 mg) | ORAL | Qty: 1

## 2017-07-10 MED FILL — LISINOPRIL 5 MG TAB: 5 mg | ORAL | Qty: 2

## 2017-07-10 MED FILL — NICOTINE 14 MG/24 HR DAILY PATCH: 14 mg/24 hr | TRANSDERMAL | Qty: 1

## 2017-07-10 MED FILL — OXCARBAZEPINE 150 MG TAB: 150 mg | ORAL | Qty: 4

## 2017-07-10 MED FILL — MELATONIN 3 MG TAB: 3 mg | ORAL | Qty: 2

## 2017-07-10 MED FILL — PANTOPRAZOLE 40 MG TAB, DELAYED RELEASE: 40 mg | ORAL | Qty: 1

## 2017-07-10 MED FILL — VITAMINS B COMPLEX CAPSULE: ORAL | Qty: 1

## 2017-07-10 MED FILL — ZIPRASIDONE 40 MG CAP: 40 mg | ORAL | Qty: 1

## 2017-07-10 NOTE — Behavioral Health Treatment Team (Signed)
Chief Complaint:  Could not be obtained    Interval History:  Joan Molina has been agitated today. She was shouting loudly at the nursing station, agitated and refused to come for the interview. Kept using profanities and calling a behavioral health tech "dumb ass". I could not interview her as she refused to speak to me    Mental Status Exam:  Could not be done.       Assessment and Plan:  Joan Molina is much improved and appears to be ready for discharge.   I increased the dosage of Geodon to 52m with meals  Continue the current medication regimen   Disposition planning to continue.   I certify that this patients inpatient psychiatric hospital services furnished since the previous certification were, and continue to be, required for treatment that could reasonably be expected to improve the patient's condition, or for diagnostic study, and that the patient continues to need, on a daily basis, active treatment furnished directly by or requiring the supervision of inpatient psychiatric facility personnel. In addition, the hospital records show that services furnished were intensive treatment services, admission or related services, or equivalent services.     Current Facility-Administered Medications   Medication Dose Route Frequency   ??? nicotine (NICODERM CQ) 14 mg/24 hr patch 1 Patch  1 Patch TransDERmal DAILY   ??? clonazePAM (KlonoPIN) tablet 0.5 mg  0.5 mg Oral BID   ??? ziprasidone (GEODON) capsule 40 mg  40 mg Oral BID WITH MEALS   ??? OXcarbazepine (TRILEPTAL) tablet 600 mg  600 mg Oral BID   ??? calcium carbonate (TUMS) chewable tablet 200 mg [elemental]  200 mg Oral BID WITH MEALS   ??? benztropine (COGENTIN) tablet 1 mg  1 mg Oral BID   ??? vitamin B complex capsule 1 Cap  1 Cap Oral DAILY   ??? lisinopril (PRINIVIL, ZESTRIL) tablet 10 mg  10 mg Oral DAILY   ??? pantoprazole (PROTONIX) tablet 40 mg  40 mg Oral DAILY       Physical Exam:         Past Medical History:  Past Medical History:   Diagnosis Date    ??? [facility]   ??? Hypertension    ??? Psychiatric disorder     Bipolar   ??? Stroke ([facility]            Labs:  Lab Results   Component Value Date/Time    WBC 6.7 07/01/2017 08:30 PM    HGB 11.9 07/01/2017 08:30 PM    HCT 37.6 07/01/2017 08:30 PM    PLATELET 300 07/01/2017 08:30 PM    MCV 87.6 07/01/2017 08:30 PM      Lab Results   Component Value Date/Time    Sodium 139 07/01/2017 08:30 PM    Potassium 3.7 07/01/2017 08:30 PM    Chloride 105 07/01/2017 08:30 PM    CO2 25 07/01/2017 08:30 PM    Anion gap 9 07/01/2017 08:30 PM    Glucose 98 07/01/2017 08:30 PM    BUN 7 07/01/2017 08:30 PM    Creatinine 0.79 07/01/2017 08:30 PM    BUN/Creatinine ratio 9 (L) 07/01/2017 08:30 PM    GFR est AA >60 07/01/2017 08:30 PM    GFR est non-AA >60 07/01/2017 08:30 PM    Calcium 8.7 07/01/2017 08:30 PM    Bilirubin, total 0.2 07/01/2017 08:30 PM    AST (SGOT) 21 07/01/2017 08:30 PM    Alk. phosphatase 69 07/01/2017 08:30 PM  Protein, total 7.6 07/01/2017 08:30 PM    Albumin 3.8 07/01/2017 08:30 PM    Globulin 3.8 07/01/2017 08:30 PM    A-G Ratio 1.0 (L) 07/01/2017 08:30 PM    ALT (SGPT) 20 07/01/2017 08:30 PM      Vitals:    07/08/17 2105 07/09/17 0745 07/09/17 2000 07/10/17 0753   BP: 145/80 131/83 146/84    Pulse: 82 73 88    Resp: '18 18 18 18   ' Temp: 98.8 ??F (37.1 ??C) 98.1 ??F (36.7 ??C) 97.9 ??F (36.6 ??C)    SpO2: 100% 100% 100%    Weight:       Height:

## 2017-07-10 NOTE — Progress Notes (Signed)
Patient is alert and oriented. Denies SI/HI and reports A/V/H. She is focused on being discharged tomorrow. She can switch moods quickly. Hx of being homeless and is anxious to be placed. In the past she has had a "bad experience" with being placed in a dirty place. Will continue to monitor patient status and assess needs per [facility] protocol.

## 2017-07-11 MED ORDER — OXCARBAZEPINE 600 MG TAB
600 mg | ORAL_TABLET | Freq: Two times a day (BID) | ORAL | 0 refills | Status: AC
Start: 2017-07-11 — End: ?

## 2017-07-11 MED ORDER — LISINOPRIL 10 MG TAB
10 mg | ORAL_TABLET | Freq: Every day | ORAL | 0 refills | Status: AC
Start: 2017-07-11 — End: ?

## 2017-07-11 MED ORDER — SENNOSIDES 8.6 MG TAB
8.6 mg | ORAL_TABLET | Freq: Every day | ORAL | 0 refills | Status: AC | PRN
Start: 2017-07-11 — End: ?

## 2017-07-11 MED ORDER — ZIPRASIDONE 20 MG CAP
20 mg | Freq: Two times a day (BID) | ORAL | Status: DC
Start: 2017-07-11 — End: 2017-07-11
  Administered 2017-07-11: 13:00:00 via ORAL

## 2017-07-11 MED ORDER — MELATONIN 3 MG TAB
3 mg | ORAL_TABLET | Freq: Every evening | ORAL | 0 refills | Status: AC | PRN
Start: 2017-07-11 — End: ?

## 2017-07-11 MED ORDER — B COMPLEX VITAMINS CAP
ORAL_CAPSULE | Freq: Every day | ORAL | 0 refills | Status: AC
Start: 2017-07-11 — End: ?

## 2017-07-11 MED ORDER — ZIPRASIDONE 60 MG CAP
60 mg | ORAL_CAPSULE | Freq: Two times a day (BID) | ORAL | 0 refills | Status: AC
Start: 2017-07-11 — End: ?

## 2017-07-11 MED FILL — PANTOPRAZOLE 40 MG TAB, DELAYED RELEASE: 40 mg | ORAL | Qty: 1

## 2017-07-11 MED FILL — NICOTINE 14 MG/24 HR DAILY PATCH: 14 mg/24 hr | TRANSDERMAL | Qty: 1

## 2017-07-11 MED FILL — MELATONIN 3 MG TAB: 3 mg | ORAL | Qty: 2

## 2017-07-11 MED FILL — LISINOPRIL 5 MG TAB: 5 mg | ORAL | Qty: 2

## 2017-07-11 MED FILL — VITAMINS B COMPLEX CAPSULE: ORAL | Qty: 1

## 2017-07-11 MED FILL — ZIPRASIDONE 20 MG CAP: 20 mg | ORAL | Qty: 1

## 2017-07-11 MED FILL — CALCIUM CARBONATE 200 MG (500 MG) CHEWABLE TAB: 200 mg calcium (500 mg) | ORAL | Qty: 1

## 2017-07-11 MED FILL — OXCARBAZEPINE 150 MG TAB: 150 mg | ORAL | Qty: 4

## 2017-07-11 MED FILL — CLONAZEPAM 0.5 MG TAB: 0.5 mg | ORAL | Qty: 1

## 2017-07-11 MED FILL — BENZTROPINE 1 MG TAB: 1 mg | ORAL | Qty: 1

## 2017-07-11 NOTE — Discharge Summary (Signed)
PSYCHIATRIC DISCHARGE SUMMARY         IDENTIFICATION:    Patient Name  Joan Molina   Date of Birth 01-11-1981   CSN [id number]   Medical Record Number  [id number]      Age  37 y.o.   PCP None   Admit date:  07/01/2017    Discharge date: 07/11/2017   Room Number  78/02  @ [facility]   Date of Service  07/11/2017            TYPE OF DISCHARGE: REGULAR               CONDITION AT DISCHARGE: improved and fair       PROVISIONAL & DISCHARGE DIAGNOSES:    Problem List  Never Reviewed          Codes Class    * (Principal) Bipolar 1 disorder, mixed, severe ([place]) ICD-10-CM: F31.63  ICD-9-CM: 296.63               Active Hospital Problems    *Bipolar 1 disorder, mixed, severe ([place])        DISCHARGE DIAGNOSIS:   Axis I:  SEE ABOVE  Axis II: SEE ABOVE  Axis III: SEE ABOVE  Axis IV:  lack of structure  Axis V:  20 on admission, 55 on discharge     CC & HISTORY OF PRESENT ILLNESS:  "I am in a hopeless situation"    Patient was not cooperative. Gave only scanty information. Reports she has become hopeless because she is homeless. Was recently in [place] and says she was discharged to a "crack house."  Feels "alienated and hopeless." Talks about self in the 3rd person. Appears to be paranoid. Declines further evaluation.??  ??  Per [facility] note, Patient is a 37 year old female seen face to face in the ER. ??She came to the ER reporting suicidal ideation without a plan. ??She reported she was psychiatrically admitted to [facility] in March and upon discharge was "sent to a crack house" to live so she left. ??She continuously spoke about being "tired of reaching out for help and being turned away." ??She reported she has been in [place] since 2016. ??She has been homeless a good part of this time. ??She was very focused on not wanting to be around "crack heads" and having issues with housing being around people using crack. ??She reported she feels hopeless. ??She denied homicidal ideation or symptoms of psychosis. ??However, she was  observed by nursing staff apparently responding to internal stimuli. ??Patient appears to be manic, as she is very irritable and at times made statement that were difficult to follow. ??She reported she had a stroke in 2018 and since then "anything I take can upset my brain," so she hasn't taken medication consistently since then. ??She reported she has a history of suicide attempts but would not provide any information about what she did or when they occurred. ??She talked about [facility] not helping her, so [facility] was contacted for collateral. ??They reported she has a history of Schizoaffective Disorder Bipolar Type and was closed to services in May 2018 due to treatment noncompliance. ??Patient is requesting admission. ??Since we have no information about her baseline and level of irritability on a day to day basis, she will be admitted for treatment.  ??  4/22- patient has been labile, responding to internal stimuli, provides a grossly disorganized narrative. Patient continues to be labile, irritable, misperceiving interactions with staff, refusing medications. Patient denies, then  endorses SI and PI. Patient informed that treatment would consist of mood stabilizer and antipsychotics, and began to scream about not needing medication, and insisting she has a seizure disorder and not psychosis. Refused PRNs, was redirectable.  4/23 - Pt was TDO'd by crisis yesterday. Later came to RN station, became agitated without any apparent provocation, demanded medications but refused antispsychotic and mood stabilizer. Patient placed into seclusion due to agitation, got Geodon 20 mg IM. Patient states that she had 'liver problems' due to Depakote in the past, but is amenable to taking Trileptal.  4/24 - patient has been visible, irritable, continues to be labile and grossly manic. Patient given Geodon IM due to increased agitation, slept 7 hours. Patient refused Lorayne Bender stating she does not need it. Court order explained, does not voice  understanding. Patient noted to be involved in a verbal altercation with another patient, due to other patient's making racial statements.  4/25- patient less agitated, still grossly manic, irritable; had an outburst this morning but was redirected. Patient has been medication compliant with much encouragement, no PRNS given. Slept 8 hours. SW working with pt to obtain placement to ALF upon discharge.  4/26- patient has been more appropriate, still generally irritable, with intense affect. Has outbursts at random with no provocation. No PRNs given, patient compliant with medication. Patient counseled on coping skills, frustration tolerance, did not voice understanding.  ??     SOCIAL HISTORY:    Social History     Socioeconomic History   ??? Marital status: SINGLE     Spouse name: Not on file   ??? Number of children: Not on file   ??? Years of education: Not on file   ??? Highest education level: Not on file   Occupational History   ??? Not on file   Social Needs   ??? Financial resource strain: Not on file   ??? Food insecurity:     Worry: Not on file     Inability: Not on file   ??? Transportation needs:     Medical: Not on file     Non-medical: Not on file   Tobacco Use   ??? Smoking status: Current Every Day Smoker     Packs/day: 1.00     Years: 14.00     Pack years: 14.00   ??? Smokeless tobacco: Never Used   Substance and Sexual Activity   ??? Alcohol use: No   ??? Drug use: Yes     Types: Marijuana   ??? Sexual activity: Not on file   Lifestyle   ??? Physical activity:     Days per week: Not on file     Minutes per session: Not on file   ??? Stress: Not on file   Relationships   ??? Social connections:     Talks on phone: Not on file     Gets together: Not on file     Attends religious service: Not on file     Active member of club or organization: Not on file     Attends meetings of clubs or organizations: Not on file     Relationship status: Not on file   ??? Intimate partner violence:     Fear of current or ex partner: Not on file      Emotionally abused: Not on file     Physically abused: Not on file     Forced sexual activity: Not on file   Other Topics Concern   ??? Not on  file   Social History Narrative   ??? Not on file      FAMILY HISTORY:   History reviewed. No pertinent family history.          HOSPITALIZATION COURSE:    Bethani Toth was admitted to the inpatient psychiatric unit [facility] for acute psychiatric stabilization in regards to symptomatology as described in the HPI above. The differential diagnosis at time of admission included: schizoaffective vs bipolar disorder.  While on the unit Aven Szafranski was involved in individual, group, occupational and milieu therapy.  Psychiatric medications were adjusted during this hospitalization including Geodon and Trileptal. Klonopin was given for agitation and tapered prior to discharge.   Meagon Grilli demonstrated a slow, but progressive improvement in overall condition.  Much of patient's initial presentation appeared to be related to situational stressors, effects of medication non-compliance and psychological factors.  Please see individual progress notes for more specific details regarding patient's hospitalization course.     At time of discharge, Bristyn Maynor is without significant problems of depression, psychosis, or mania. Patient free of suicidal and homicidal ideations (appears to be at very low risk of suicide or homicide) and reports many positive predictive factors in terms of not attempting suicide or homicide. Overall presentation at time of discharge is most consistent with the diagnosis of bipolar disorder, mixed severe.    Patient has maximized benefit to be derived from acute inpatient psychiatric treatment.  All members of the treatment team concur with each other in regards to plans for discharge today. Patient and family are aware and in agreement with discharge and discharge plan.         LABS AND IMAGAING:    Labs Reviewed   CBC WITH AUTOMATED DIFF - Abnormal;  Notable for the following components:       Result Value    RDW 16.4 (*)     All other components within normal limits   METABOLIC PANEL, COMPREHENSIVE - Abnormal; Notable for the following components:    BUN/Creatinine ratio 9 (*)     A-G Ratio 1.0 (*)     All other components within normal limits   URINALYSIS W/ REFLEX CULTURE - Abnormal; Notable for the following components:    Protein TRACE (*)     Blood LARGE (*)     Mucus 3+ (*)     All other components within normal limits   DRUG SCREEN, URINE - Abnormal; Notable for the following components:    THC (TH-CANNABINOL) POSITIVE (*)     All other components within normal limits   ETHYL ALCOHOL   HEMOGLOBIN A1C WITH EAG   GLUCOSE, FASTING   LIPID PANEL   HCG URINE, QL. - POC     No results found for: DS35, PHEN, PHENO, PHENT, DILF, DS39, PHENY, PTN, VALF2, VALAC, VALP, VALPR, DS6, CRBAM, CRBAMP, CARB2, XCRBAM  Admission on 07/01/2017, Discharged on 07/11/2017   Component Date Value Ref Range Status   ??? WBC 07/01/2017 6.7  3.6 - 11.0 K/uL Final   ??? RBC 07/01/2017 4.29  3.80 - 5.20 M/uL Final   ??? HGB 07/01/2017 11.9  11.5 - 16.0 g/dL Final   ??? HCT 07/01/2017 37.6  35.0 - 47.0 % Final   ??? MCV 07/01/2017 87.6  80.0 - 99.0 FL Final   ??? MCH 07/01/2017 27.7  26.0 - 34.0 PG Final   ??? MCHC 07/01/2017 31.6  30.0 - 36.5 g/dL Final   ??? RDW 07/01/2017 16.4* 11.5 - 14.5 %  Final   ??? PLATELET 07/01/2017 300  150 - 400 K/uL Final   ??? MPV 07/01/2017 8.9  8.9 - 12.9 FL Final   ??? NRBC 07/01/2017 0.0  0 PER 100 WBC Final   ??? ABSOLUTE NRBC 07/01/2017 0.00  0.00 - 0.01 K/uL Final   ??? NEUTROPHILS 07/01/2017 39  32 - 75 % Final   ??? LYMPHOCYTES 07/01/2017 47  12 - 49 % Final   ??? MONOCYTES 07/01/2017 8  5 - 13 % Final   ??? EOSINOPHILS 07/01/2017 6  0 - 7 % Final   ??? BASOPHILS 07/01/2017 0  0 - 1 % Final   ??? IMMATURE GRANULOCYTES 07/01/2017 0  0.0 - 0.5 % Final   ??? ABS. NEUTROPHILS 07/01/2017 2.6  1.8 - 8.0 K/UL Final   ??? ABS. LYMPHOCYTES 07/01/2017 3.2  0.8 - 3.5 K/UL Final   ??? ABS. MONOCYTES  07/01/2017 0.5  0.0 - 1.0 K/UL Final   ??? ABS. EOSINOPHILS 07/01/2017 0.4  0.0 - 0.4 K/UL Final   ??? ABS. BASOPHILS 07/01/2017 0.0  0.0 - 0.1 K/UL Final   ??? ABS. IMM. GRANS. 07/01/2017 0.0  0.00 - 0.04 K/UL Final   ??? DF 07/01/2017 AUTOMATED    Final   ??? Sodium 07/01/2017 139  136 - 145 mmol/L Final   ??? Potassium 07/01/2017 3.7  3.5 - 5.1 mmol/L Final   ??? Chloride 07/01/2017 105  97 - 108 mmol/L Final   ??? CO2 07/01/2017 25  21 - 32 mmol/L Final   ??? Anion gap 07/01/2017 9  5 - 15 mmol/L Final   ??? Glucose 07/01/2017 98  65 - 100 mg/dL Final   ??? BUN 07/01/2017 7  6 - 20 MG/DL Final   ??? Creatinine 07/01/2017 0.79  0.55 - 1.02 MG/DL Final   ??? BUN/Creatinine ratio 07/01/2017 9* 12 - 20   Final   ??? GFR est AA 07/01/2017 >60  >60 ml/min/1.58m Final   ??? GFR est non-AA 07/01/2017 >60  >60 ml/min/1.761mFinal   ??? Calcium 07/01/2017 8.7  8.5 - 10.1 MG/DL Final   ??? Bilirubin, total 07/01/2017 0.2  0.2 - 1.0 MG/DL Final   ??? ALT (SGPT) 07/01/2017 20  12 - 78 U/L Final   ??? AST (SGOT) 07/01/2017 21  15 - 37 U/L Final   ??? Alk. phosphatase 07/01/2017 69  45 - 117 U/L Final   ??? Protein, total 07/01/2017 7.6  6.4 - 8.2 g/dL Final   ??? Albumin 07/01/2017 3.8  3.5 - 5.0 g/dL Final   ??? Globulin 07/01/2017 3.8  2.0 - 4.0 g/dL Final   ??? A-G Ratio 07/01/2017 1.0* 1.1 - 2.2   Final   ??? Color 07/01/2017 YELLOW/STRAW    Final   ??? Appearance 07/01/2017 CLEAR  CLEAR   Final   ??? Specific gravity 07/01/2017 1.030  1.003 - 1.030   Final   ??? pH (UA) 07/01/2017 5.5  5.0 - 8.0   Final   ??? Protein 07/01/2017 TRACE* NEG mg/dL Final   ??? Glucose 07/01/2017 NEGATIVE   NEG mg/dL Final   ??? Ketone 07/01/2017 NEGATIVE   NEG mg/dL Final   ??? Bilirubin 07/01/2017 NEGATIVE   NEG   Final   ??? Blood 07/01/2017 LARGE* NEG   Final   ??? Urobilinogen 07/01/2017 1.0  0.2 - 1.0 EU/dL Final   ??? Nitrites 07/01/2017 NEGATIVE   NEG   Final   ??? Leukocyte Esterase 07/01/2017 NEGATIVE   NEG   Final   ???  WBC 07/01/2017 0-4  0 - 4 /hpf Final   ??? RBC 07/01/2017 5-10  0 - 5 /hpf Final   ???  Epithelial cells 07/01/2017 FEW  FEW /lpf Final   ??? Bacteria 07/01/2017 NEGATIVE   NEG /hpf Final   ??? UA:UC IF INDICATED 07/01/2017 CULTURE NOT INDICATED BY UA RESULT  CNI   Final   ??? Mucus 07/01/2017 3+* NEG /lpf Final   ??? AMPHETAMINES 07/01/2017 NEGATIVE   NEG   Final   ??? BARBITURATES 07/01/2017 NEGATIVE   NEG   Final   ??? BENZODIAZEPINES 07/01/2017 NEGATIVE   NEG   Final   ??? COCAINE 07/01/2017 NEGATIVE   NEG   Final   ??? METHADONE 07/01/2017 NEGATIVE   NEG   Final   ??? OPIATES 07/01/2017 NEGATIVE   NEG   Final   ??? PCP(PHENCYCLIDINE) 07/01/2017 NEGATIVE   NEG   Final   ??? THC (TH-CANNABINOL) 07/01/2017 POSITIVE* NEG   Final   ??? Drug screen comment 07/01/2017 (NOTE)   Final   ??? ALCOHOL(ETHYL),SERUM 07/01/2017 <10  <10 MG/DL Final   ??? Pregnancy test,urine (POC) 07/01/2017 NEGATIVE   NEG   Final     Xr Chest Port    Result Date: 07/07/2017  EXAM:  XR CHEST PORT INDICATION:  evaluate for cavitary lesion COMPARISON:  None. FINDINGS: A portable AP radiograph of the chest was obtained at 14:57 hours. The lungs are clear.  The cardiac and mediastinal contours and pulmonary vascularity are normal.  The bones and soft tissues are unremarkable.     IMPRESSION: No significant abnormalities.                   DISPOSITION:    Residence. Patient to f/u with psychiatric and psychotherapy appointments. Patient is to f/u with internist as directed.               FOLLOW-UP CARE:    Activity as tolerated  Regular diet  Wound Care: none needed.  Follow-up Information     Follow up With Specialties Details Why Contact Info    Virgia Land, PMHNP  Go on 07/21/2017 Medication management appointment on Thursday, May 9th at 9:30 AM.  [facility]  Address:  [address], [place] [address]  Phone:  [phone]  Fax:  [phone]    [facility] today This is the agency you have been connected with to begin Crisis Stabilization and mental health skill building services.  Address: [address], [place] [address]  Phone:  [phone]                   PROGNOSIS:   Guarded / Poor---- based on nature of patient's pathology/ies and treatment compliance issues.  Prognosis is greatly dependent upon patient's ability to remain sober and to follow up with scheduled appointments as well as to comply with psychiatric medications as prescribed.            DISCHARGE MEDICATIONS:    Informed consent given for the use of following psychotropic medications:  Discharge Medication List as of 07/11/2017 10:46 AM      START taking these medications    Details   senna (SENOKOT) 8.6 mg tablet Take 1 Tab by mouth daily as needed for Constipation. Indications: constipation, Print, Disp-30 Tab, R-0      vitamin B complex capsule Take 1 Cap by mouth daily. Indications: lack in vitamins, Print, Disp-30 Cap, R-0  melatonin 3 mg tablet Take 2 Tabs by mouth nightly as needed (Insomnia). Indications: Insomnia, Print, Disp-60 Tab, R-0         CONTINUE these medications which have CHANGED    Details   lisinopril (PRINIVIL, ZESTRIL) 10 mg tablet Take 1 Tab by mouth daily. Indications: high blood pressure, Print, Disp-30 Tab, R-0      OXcarbazepine (TRILEPTAL) 600 mg tablet Take 1 Tab by mouth two (2) times a day. Indications: Bipolar disorder, Print, Disp-60 Tab, R-0      ziprasidone (GEODON) 60 mg capsule Take 1 Cap by mouth two (2) times daily (with meals). Indications: Mania associated with Bipolar Disorder, Print, Disp-60 Cap, R-0         CONTINUE these medications which have NOT CHANGED    Details   esomeprazole (NEXIUM) 40 mg capsule Take 40 mg by mouth daily., Historical Med      acetaminophen (TYLENOL) 325 mg tablet Take 2 Tabs by mouth every four (4) hours as needed for Pain., Normal, Disp-20 Tab, R-0      naproxen (NAPROSYN) 375 mg tablet Take 1 Tab by mouth two (2) times daily (with meals)., Normal, Disp-20 Tab, R-0         STOP taking these medications       benztropine (COGENTIN) 2 mg tablet Comments:   Reason for Stopping:                      A  coordinated, multidisplinary treatment team round was conducted with Calene Heming---this is done daily here at [facility]. This team consists of the nurse, psychiatric unit pharmacist, Catering manager.     I have spent greater than 35 minutes on discharge work.    Signed:  Raeford Razor, MD  07/11/2017

## 2017-07-11 NOTE — Behavioral Health Treatment Team (Signed)
Pt is alert and oriented x person, place and time. Pt denies any SI/HI or AV hallucinations. Pt denies any depression. Pt plans to return home. Discharge information reviewed with patient. Pt verbalizes understanding. Pt's belongings/valuables returned. Pt to be transported home by bus.

## 2017-07-11 NOTE — Behavioral Health Treatment Team (Signed)
Joan Molina is with an intense mood and hostile towards staff and her peers. She is verbally abusive towards her peers. Writer explained to patient the MD had ordered labs for the morning. She cursed at Clinical research associate and shouted "I'm not letting you fuckers take my blood! I gave blood once. I'm not doing it again." Thera is difficult to redirect and is easily agitated when staff says anything to her. She requested Melatonin this evening and became upset when she learned the medication was ordered as a prn medication. She refused to answer any questions about ideations or hallucinations. Q 15 minute safety checks continue. Worthy Flank BSN, RN

## 2017-07-11 NOTE — Behavioral Health Treatment Team (Signed)
Patient slept 9 hours. Tasha Washington BSN, RN

## 2017-07-11 NOTE — Discharge Summary (Signed)
PSYCHIATRIC DISCHARGE SUMMARY       IDENTIFICATION:    Patient Name  Joan Molina   Date of Birth 1980-07-30   CSN [id number]   Medical Record Number  [id number]      Age  37 y.o.   PCP None   Admit date:  07/01/2017    Discharge date: 07/11/2017   Room Number  64/02  @ [facility]   Date of Service  07/11/2017            TYPE OF DISCHARGE: REGULAR               CONDITION AT DISCHARGE: improved and fair       PROVISIONAL & DISCHARGE DIAGNOSES:    Problem List  Never Reviewed          Codes Class    * (Principal) Bipolar 1 disorder, mixed, severe ([place]) ICD-10-CM: F31.63  ICD-9-CM: 296.63               Active Hospital Problems    *Bipolar 1 disorder, mixed, severe ([place])        DISCHARGE DIAGNOSIS:   Axis I:  SEE ABOVE  Axis II: SEE ABOVE  Axis III: SEE ABOVE  Axis IV:  lack of structure  Axis V:  20 on admission, 55 on discharge     CC & HISTORY OF PRESENT ILLNESS:  "I am in a hopeless situation"    Patient was not cooperative. Gave only scanty information. Reports she has become hopeless because she is homeless. Was recently in [place] and says she was discharged to a "crack house."  Feels "alienated and hopeless." Talks about self in the 3rd person. Appears to be paranoid. Declines further evaluation.??  ??  Per [facility] note, Patient is a 37 year old female seen face to face in the ER. ??She came to the ER reporting suicidal ideation without a plan. ??She reported she was psychiatrically admitted to [facility] in March and upon discharge was "sent to a crack house" to live so she left. ??She continuously spoke about being "tired of reaching out for help and being turned away." ??She reported she has been in [place] since 2016. ??She has been homeless a good part of this time. ??She was very focused on not wanting to be around "crack heads" and having issues with housing being around people using crack. ??She reported she feels hopeless. ??She denied  homicidal ideation or symptoms of psychosis. ??However, she was observed by nursing staff apparently responding to internal stimuli. ??Patient appears to be manic, as she is very irritable and at times made statement that were difficult to follow. ??She reported she had a stroke in 2018 and since then "anything I take can upset my brain," so she hasn't taken medication consistently since then. ??She reported she has a history of suicide attempts but would not provide any information about what she did or when they occurred. ??She talked about [facility] not helping her, so [facility] was contacted for collateral. ??They reported she has a history of Schizoaffective Disorder Bipolar Type and was closed to services in May 2018 due to treatment noncompliance. ??Patient is requesting admission. ??Since we have no information about her baseline and level of irritability on a day to day basis, she will be admitted for treatment.  ??  4/22- patient has been labile, responding to internal stimuli, provides a grossly disorganized narrative. Patient continues to be labile, irritable, misperceiving interactions with staff, refusing medications. Patient denies, then endorses SI  and PI. Patient informed that treatment would consist of mood stabilizer and antipsychotics, and began to scream about not needing medication, and insisting she has a seizure disorder and not psychosis. Refused PRNs, was redirectable.  4/23 - Pt was TDO'd by crisis yesterday. Later came to RN station, became agitated without any apparent provocation, demanded medications but refused antispsychotic and mood stabilizer. Patient placed into seclusion due to agitation, got Geodon 20 mg IM. Patient states that she had 'liver problems' due to Depakote in the past, but is amenable to taking Trileptal.  4/24 - patient has been visible, irritable, continues to be labile and grossly manic. Patient given Geodon IM due to increased agitation, slept 7  hours. Patient refused Lorayne Bender stating she does not need it. Court order explained, does not voice understanding. Patient noted to be involved in a verbal altercation with another patient, due to other patient's making racial statements.  4/25- patient less agitated, still grossly manic, irritable; had an outburst this morning but was redirected. Patient has been medication compliant with much encouragement, no PRNS given. Slept 8 hours. SW working with pt to obtain placement to ALF upon discharge.  4/26- patient has been more appropriate, still generally irritable, with intense affect. Has outbursts at random with no provocation. No PRNs given, patient compliant with medication. Patient counseled on coping skills, frustration tolerance, did not voice understanding.  ??     SOCIAL HISTORY:    Social History     Socioeconomic History   ??? Marital status: SINGLE     Spouse name: Not on file   ??? Number of children: Not on file   ??? Years of education: Not on file   ??? Highest education level: Not on file   Occupational History   ??? Not on file   Social Needs   ??? Financial resource strain: Not on file   ??? Food insecurity:     Worry: Not on file     Inability: Not on file   ??? Transportation needs:     Medical: Not on file     Non-medical: Not on file   Tobacco Use   ??? Smoking status: Current Every Day Smoker     Packs/day: 1.00     Years: 14.00     Pack years: 14.00   ??? Smokeless tobacco: Never Used   Substance and Sexual Activity   ??? Alcohol use: No   ??? Drug use: Yes     Types: Marijuana   ??? Sexual activity: Not on file   Lifestyle   ??? Physical activity:     Days per week: Not on file     Minutes per session: Not on file   ??? Stress: Not on file   Relationships   ??? Social connections:     Talks on phone: Not on file     Gets together: Not on file     Attends religious service: Not on file     Active member of club or organization: Not on file     Attends meetings of clubs or organizations: Not on file      Relationship status: Not on file   ??? Intimate partner violence:     Fear of current or ex partner: Not on file     Emotionally abused: Not on file     Physically abused: Not on file     Forced sexual activity: Not on file   Other Topics Concern   ??? Not on file  Social History Narrative   ??? Not on file      FAMILY HISTORY:   History reviewed. No pertinent family history.          HOSPITALIZATION COURSE:    Teddy Nagorski was admitted to the inpatient psychiatric unit [facility] for acute psychiatric stabilization in regards to symptomatology as described in the HPI above. The differential diagnosis at time of admission included: schizoaffective vs bipolar disorder.  While on the unit Makenli Swanton was involved in individual, group, occupational and milieu therapy.  Psychiatric medications were adjusted during this hospitalization including Geodon and Trileptal. Klonopin was given for agitation and tapered prior to discharge.   Whitney Batta demonstrated a slow, but progressive improvement in overall condition.  Much of patient's initial presentation appeared to be related to situational stressors, effects of medication non-compliance and psychological factors.  Please see individual progress notes for more specific details regarding patient's hospitalization course.     At time of discharge, Zorah Panchal is without significant problems of depression, psychosis, or mania. Patient free of suicidal and homicidal ideations (appears to be at very low risk of suicide or homicide) and reports many positive predictive factors in terms of not attempting suicide or homicide. Overall presentation at time of discharge is most consistent with the diagnosis of bipolar disorder, mixed severe.    Patient has maximized benefit to be derived from acute inpatient psychiatric treatment.  All members of the treatment team concur with each other in regards to plans for discharge today. Patient and family are  aware and in agreement with discharge and discharge plan.         LABS AND IMAGAING:    Labs Reviewed   CBC WITH AUTOMATED DIFF - Abnormal; Notable for the following components:       Result Value    RDW 16.4 (*)     All other components within normal limits   METABOLIC PANEL, COMPREHENSIVE - Abnormal; Notable for the following components:    BUN/Creatinine ratio 9 (*)     A-G Ratio 1.0 (*)     All other components within normal limits   URINALYSIS W/ REFLEX CULTURE - Abnormal; Notable for the following components:    Protein TRACE (*)     Blood LARGE (*)     Mucus 3+ (*)     All other components within normal limits   DRUG SCREEN, URINE - Abnormal; Notable for the following components:    THC (TH-CANNABINOL) POSITIVE (*)     All other components within normal limits   ETHYL ALCOHOL   HEMOGLOBIN A1C WITH EAG   GLUCOSE, FASTING   LIPID PANEL   HCG URINE, QL. - POC     No results found for: DS35, PHEN, PHENO, PHENT, DILF, DS39, PHENY, PTN, VALF2, VALAC, VALP, VALPR, DS6, CRBAM, CRBAMP, CARB2, XCRBAM  Admission on 07/01/2017, Discharged on 07/11/2017   Component Date Value Ref Range Status   ??? WBC 07/01/2017 6.7  3.6 - 11.0 K/uL Final   ??? RBC 07/01/2017 4.29  3.80 - 5.20 M/uL Final   ??? HGB 07/01/2017 11.9  11.5 - 16.0 g/dL Final   ??? HCT 07/01/2017 37.6  35.0 - 47.0 % Final   ??? MCV 07/01/2017 87.6  80.0 - 99.0 FL Final   ??? MCH 07/01/2017 27.7  26.0 - 34.0 PG Final   ??? MCHC 07/01/2017 31.6  30.0 - 36.5 g/dL Final   ??? RDW 07/01/2017 16.4* 11.5 - 14.5 % Final   ???  PLATELET 07/01/2017 300  150 - 400 K/uL Final   ??? MPV 07/01/2017 8.9  8.9 - 12.9 FL Final   ??? NRBC 07/01/2017 0.0  0 PER 100 WBC Final   ??? ABSOLUTE NRBC 07/01/2017 0.00  0.00 - 0.01 K/uL Final   ??? NEUTROPHILS 07/01/2017 39  32 - 75 % Final   ??? LYMPHOCYTES 07/01/2017 47  12 - 49 % Final   ??? MONOCYTES 07/01/2017 8  5 - 13 % Final   ??? EOSINOPHILS 07/01/2017 6  0 - 7 % Final   ??? BASOPHILS 07/01/2017 0  0 - 1 % Final    ??? IMMATURE GRANULOCYTES 07/01/2017 0  0.0 - 0.5 % Final   ??? ABS. NEUTROPHILS 07/01/2017 2.6  1.8 - 8.0 K/UL Final   ??? ABS. LYMPHOCYTES 07/01/2017 3.2  0.8 - 3.5 K/UL Final   ??? ABS. MONOCYTES 07/01/2017 0.5  0.0 - 1.0 K/UL Final   ??? ABS. EOSINOPHILS 07/01/2017 0.4  0.0 - 0.4 K/UL Final   ??? ABS. BASOPHILS 07/01/2017 0.0  0.0 - 0.1 K/UL Final   ??? ABS. IMM. GRANS. 07/01/2017 0.0  0.00 - 0.04 K/UL Final   ??? DF 07/01/2017 AUTOMATED    Final   ??? Sodium 07/01/2017 139  136 - 145 mmol/L Final   ??? Potassium 07/01/2017 3.7  3.5 - 5.1 mmol/L Final   ??? Chloride 07/01/2017 105  97 - 108 mmol/L Final   ??? CO2 07/01/2017 25  21 - 32 mmol/L Final   ??? Anion gap 07/01/2017 9  5 - 15 mmol/L Final   ??? Glucose 07/01/2017 98  65 - 100 mg/dL Final   ??? BUN 07/01/2017 7  6 - 20 MG/DL Final   ??? Creatinine 07/01/2017 0.79  0.55 - 1.02 MG/DL Final   ??? BUN/Creatinine ratio 07/01/2017 9* 12 - 20   Final   ??? GFR est AA 07/01/2017 >60  >60 ml/min/1.45m Final   ??? GFR est non-AA 07/01/2017 >60  >60 ml/min/1.746mFinal   ??? Calcium 07/01/2017 8.7  8.5 - 10.1 MG/DL Final   ??? Bilirubin, total 07/01/2017 0.2  0.2 - 1.0 MG/DL Final   ??? ALT (SGPT) 07/01/2017 20  12 - 78 U/L Final   ??? AST (SGOT) 07/01/2017 21  15 - 37 U/L Final   ??? Alk. phosphatase 07/01/2017 69  45 - 117 U/L Final   ??? Protein, total 07/01/2017 7.6  6.4 - 8.2 g/dL Final   ??? Albumin 07/01/2017 3.8  3.5 - 5.0 g/dL Final   ??? Globulin 07/01/2017 3.8  2.0 - 4.0 g/dL Final   ??? A-G Ratio 07/01/2017 1.0* 1.1 - 2.2   Final   ??? Color 07/01/2017 YELLOW/STRAW    Final   ??? Appearance 07/01/2017 CLEAR  CLEAR   Final   ??? Specific gravity 07/01/2017 1.030  1.003 - 1.030   Final   ??? pH (UA) 07/01/2017 5.5  5.0 - 8.0   Final   ??? Protein 07/01/2017 TRACE* NEG mg/dL Final   ??? Glucose 07/01/2017 NEGATIVE   NEG mg/dL Final   ??? Ketone 07/01/2017 NEGATIVE   NEG mg/dL Final   ??? Bilirubin 07/01/2017 NEGATIVE   NEG   Final   ??? Blood 07/01/2017 LARGE* NEG   Final   ??? Urobilinogen 07/01/2017 1.0  0.2 - 1.0 EU/dL Final    ??? Nitrites 07/01/2017 NEGATIVE   NEG   Final   ??? Leukocyte Esterase 07/01/2017 NEGATIVE   NEG   Final   ???  WBC 07/01/2017 0-4  0 - 4 /hpf Final   ??? RBC 07/01/2017 5-10  0 - 5 /hpf Final   ??? Epithelial cells 07/01/2017 FEW  FEW /lpf Final   ??? Bacteria 07/01/2017 NEGATIVE   NEG /hpf Final   ??? UA:UC IF INDICATED 07/01/2017 CULTURE NOT INDICATED BY UA RESULT  CNI   Final   ??? Mucus 07/01/2017 3+* NEG /lpf Final   ??? AMPHETAMINES 07/01/2017 NEGATIVE   NEG   Final   ??? BARBITURATES 07/01/2017 NEGATIVE   NEG   Final   ??? BENZODIAZEPINES 07/01/2017 NEGATIVE   NEG   Final   ??? COCAINE 07/01/2017 NEGATIVE   NEG   Final   ??? METHADONE 07/01/2017 NEGATIVE   NEG   Final   ??? OPIATES 07/01/2017 NEGATIVE   NEG   Final   ??? PCP(PHENCYCLIDINE) 07/01/2017 NEGATIVE   NEG   Final   ??? THC (TH-CANNABINOL) 07/01/2017 POSITIVE* NEG   Final   ??? Drug screen comment 07/01/2017 (NOTE)   Final   ??? ALCOHOL(ETHYL),SERUM 07/01/2017 <10  <10 MG/DL Final   ??? Pregnancy test,urine (POC) 07/01/2017 NEGATIVE   NEG   Final     Xr Chest Port    Result Date: 07/07/2017  EXAM:  XR CHEST PORT INDICATION:  evaluate for cavitary lesion COMPARISON:  None. FINDINGS: A portable AP radiograph of the chest was obtained at 14:57 hours. The lungs are clear.  The cardiac and mediastinal contours and pulmonary vascularity are normal.  The bones and soft tissues are unremarkable.     IMPRESSION: No significant abnormalities.                   DISPOSITION:    Residence. Patient to f/u with psychiatric and psychotherapy appointments. Patient is to f/u with internist as directed.               FOLLOW-UP CARE:    Activity as tolerated  Regular diet  Wound Care: none needed.  Follow-up Information     Follow up With Specialties Details Why Contact Info    Virgia Land, PMHNP  Go on 07/21/2017 Medication management appointment on Thursday, May 9th at 9:30 AM.  [facility]  Address:  [address], [place] [address]  Phone:  [phone]  Fax:  [phone]     [place] today This is the agency you have been connected with to begin Crisis Stabilization and mental health skill building services.  Address: [address] [place], [place] [address]  Phone: [phone]                   PROGNOSIS:   Guarded / Poor---- based on nature of patient's pathology/ies and treatment compliance issues.  Prognosis is greatly dependent upon patient's ability to remain sober and to follow up with scheduled appointments as well as to comply with psychiatric medications as prescribed.            DISCHARGE MEDICATIONS:    Informed consent given for the use of following psychotropic medications:  Discharge Medication List as of 07/11/2017 10:46 AM      START taking these medications    Details   senna (SENOKOT) 8.6 mg tablet Take 1 Tab by mouth daily as needed for Constipation. Indications: constipation, Print, Disp-30 Tab, R-0      vitamin B complex capsule Take 1 Cap by mouth daily. Indications: lack in vitamins, Print, Disp-30 Cap, R-0  melatonin 3 mg tablet Take 2 Tabs by mouth nightly as needed (Insomnia). Indications: Insomnia, Print, Disp-60 Tab, R-0         CONTINUE these medications which have CHANGED    Details   lisinopril (PRINIVIL, ZESTRIL) 10 mg tablet Take 1 Tab by mouth daily. Indications: high blood pressure, Print, Disp-30 Tab, R-0      OXcarbazepine (TRILEPTAL) 600 mg tablet Take 1 Tab by mouth two (2) times a day. Indications: Bipolar disorder, Print, Disp-60 Tab, R-0      ziprasidone (GEODON) 60 mg capsule Take 1 Cap by mouth two (2) times daily (with meals). Indications: Mania associated with Bipolar Disorder, Print, Disp-60 Cap, R-0         CONTINUE these medications which have NOT CHANGED    Details   esomeprazole (NEXIUM) 40 mg capsule Take 40 mg by mouth daily., Historical Med      acetaminophen (TYLENOL) 325 mg tablet Take 2 Tabs by mouth every four (4) hours as needed for Pain., Normal, Disp-20 Tab, R-0       naproxen (NAPROSYN) 375 mg tablet Take 1 Tab by mouth two (2) times daily (with meals)., Normal, Disp-20 Tab, R-0         STOP taking these medications       benztropine (COGENTIN) 2 mg tablet Comments:   Reason for Stopping:                      A coordinated, multidisplinary treatment team round was conducted with Kember Whaling---this is done daily here at [facility]. This team consists of the nurse, psychiatric unit pharmacist, Catering manager.     I have spent greater than 35 minutes on discharge work.    Signed:  Raeford Razor, MD  07/11/2017

## 2017-07-11 NOTE — Behavioral Health Treatment Team (Cosign Needed)
Nurse notified of elevated bp

## 2017-07-11 NOTE — Behavioral Health Treatment Team (Signed)
Pt Geodon was increased yesterday by MD.pt refuses to take 60 mg of Geodon and only took 40 mg of Geodon. MD was notified.no new orders at this time.

## 2017-07-11 NOTE — Behavioral Health Treatment Team (Signed)
Behavioral Health Transition Record to Provider    Patient Name: Joan Molina  Date of Birth: 1980/10/07  Medical Record Number: [id number]  Date of Admission: 07/01/2017  Date of Discharge: 07/11/2017    Attending Provider: Raeford Razor, *  Discharging Provider: Clint Guy, MD  To contact this individual call [phone] and ask the operator to page.  If unavailable, ask to be transferred to [facility] Provider on call.  [facility] Provider will be available on call 24/7 and during holidays.    Primary Care Provider: None    Allergies   Allergen Reactions   ??? Penicillins Hives and Itching       Reason for Admission: mania    Admission Diagnosis: Bipolar disorder ([place]) [F31.9]    * No surgery found *    Results for orders placed or performed during the hospital encounter of 07/01/17   CBC WITH AUTOMATED DIFF   Result Value Ref Range    WBC 6.7 3.6 - 11.0 K/uL    RBC 4.29 3.80 - 5.20 M/uL    HGB 11.9 11.5 - 16.0 g/dL    HCT 37.6 35.0 - 47.0 %    MCV 87.6 80.0 - 99.0 FL    MCH 27.7 26.0 - 34.0 PG    MCHC 31.6 30.0 - 36.5 g/dL    RDW 16.4 (H) 11.5 - 14.5 %    PLATELET 300 150 - 400 K/uL    MPV 8.9 8.9 - 12.9 FL    NRBC 0.0 0 PER 100 WBC    ABSOLUTE NRBC 0.00 0.00 - 0.01 K/uL    NEUTROPHILS 39 32 - 75 %    LYMPHOCYTES 47 12 - 49 %    MONOCYTES 8 5 - 13 %    EOSINOPHILS 6 0 - 7 %    BASOPHILS 0 0 - 1 %    IMMATURE GRANULOCYTES 0 0.0 - 0.5 %    ABS. NEUTROPHILS 2.6 1.8 - 8.0 K/UL    ABS. LYMPHOCYTES 3.2 0.8 - 3.5 K/UL    ABS. MONOCYTES 0.5 0.0 - 1.0 K/UL    ABS. EOSINOPHILS 0.4 0.0 - 0.4 K/UL    ABS. BASOPHILS 0.0 0.0 - 0.1 K/UL    ABS. IMM. GRANS. 0.0 0.00 - 0.04 K/UL    DF AUTOMATED     METABOLIC PANEL, COMPREHENSIVE   Result Value Ref Range    Sodium 139 136 - 145 mmol/L    Potassium 3.7 3.5 - 5.1 mmol/L    Chloride 105 97 - 108 mmol/L    CO2 25 21 - 32 mmol/L    Anion gap 9 5 - 15 mmol/L    Glucose 98 65 - 100 mg/dL    BUN 7 6 - 20 MG/DL    Creatinine 0.79 0.55 - 1.02 MG/DL     BUN/Creatinine ratio 9 (L) 12 - 20      GFR est AA >60 >60 ml/min/1.52m    GFR est non-AA >60 >60 ml/min/1.750m   Calcium 8.7 8.5 - 10.1 MG/DL    Bilirubin, total 0.2 0.2 - 1.0 MG/DL    ALT (SGPT) 20 12 - 78 U/L    AST (SGOT) 21 15 - 37 U/L    Alk. phosphatase 69 45 - 117 U/L    Protein, total 7.6 6.4 - 8.2 g/dL    Albumin 3.8 3.5 - 5.0 g/dL    Globulin 3.8 2.0 - 4.0 g/dL    A-G Ratio 1.0 (L) 1.1 - 2.2  URINALYSIS W/ REFLEX CULTURE   Result Value Ref Range    Color YELLOW/STRAW      Appearance CLEAR CLEAR      Specific gravity 1.030 1.003 - 1.030      pH (UA) 5.5 5.0 - 8.0      Protein TRACE (A) NEG mg/dL    Glucose NEGATIVE  NEG mg/dL    Ketone NEGATIVE  NEG mg/dL    Bilirubin NEGATIVE  NEG      Blood LARGE (A) NEG      Urobilinogen 1.0 0.2 - 1.0 EU/dL    Nitrites NEGATIVE  NEG      Leukocyte Esterase NEGATIVE  NEG      WBC 0-4 0 - 4 /hpf    RBC 5-10 0 - 5 /hpf    Epithelial cells FEW FEW /lpf    Bacteria NEGATIVE  NEG /hpf    UA:UC IF INDICATED CULTURE NOT INDICATED BY UA RESULT CNI      Mucus 3+ (A) NEG /lpf   DRUG SCREEN, URINE   Result Value Ref Range    AMPHETAMINES NEGATIVE  NEG      BARBITURATES NEGATIVE  NEG      BENZODIAZEPINES NEGATIVE  NEG      COCAINE NEGATIVE  NEG      METHADONE NEGATIVE  NEG      OPIATES NEGATIVE  NEG      PCP(PHENCYCLIDINE) NEGATIVE  NEG      THC (TH-CANNABINOL) POSITIVE (A) NEG      Drug screen comment (NOTE)    ETHYL ALCOHOL   Result Value Ref Range    ALCOHOL(ETHYL),SERUM <10 <10 MG/DL   HCG URINE, QL. - POC   Result Value Ref Range    Pregnancy test,urine (POC) NEGATIVE  NEG         Immunizations administered during this encounter:   There is no immunization history on file for this patient.    Screening for Metabolic Disorders for Patients on Antipsychotic Medications  (Data obtained from the EMR)    Estimated Body Mass Index  Estimated body mass index is 35 kg/m?? as calculated from the following:    Height as of this encounter: '5\' 9"'  (1.753 m).     Weight as of this encounter: 107.5 kg (237 lb).     Vital Signs/Blood Pressure  Visit Vitals  BP (!) 137/92 (BP 1 Location: Right arm, BP Patient Position: At rest)   Pulse 92   Temp 98 ??F (36.7 ??C)   Resp 16   Ht '5\' 9"'  (1.753 m)   Wt 107.5 kg (237 lb)   LMP  (LMP Unknown)   SpO2 96%   BMI 35.00 kg/m??       Blood Glucose/Hemoglobin A1c  Lab Results   Component Value Date/Time    Glucose 98 07/01/2017 08:30 PM     No results found for: HBA1C, HGBE8, HBA1CEXT     Lipid Panel  No results found for: CHOL, CHOLX, CHLST, CHOLV, 884269, HDL, LDL, LDLC, DLDLP, TGLX, TRIGL, TRIGP, CHHD, CHHDX     Discharge Diagnosis: Please refer to physician's discharge plan.     Discharge Plan: Pt was discharged today and transported by [facility] (provided pass) to [place] agency for crisis stabilization services.  Pt denies SI/HI/AH.   Pt's mood is irritable, labile and affect is mood congruent.  Pt's thought process is logical and goal oriented.  Social worker completed [facility] and sent to to multiple placement options - pt was accepted and [facility] but ultimately declined option - pt decided to work with [facility] crisis stabilization services.  Pt in agreement with [facility] at [facility].       Discharge Medication List and Instructions:   Current Discharge Medication List      START taking these medications    Details   senna (SENOKOT) 8.6 mg tablet Take 1 Tab by mouth daily as needed for Constipation. Indications: constipation  Qty: 30 Tab, Refills: 0      vitamin B complex capsule Take 1 Cap by mouth daily. Indications: lack in vitamins  Qty: 30 Cap, Refills: 0      melatonin 3 mg tablet Take 2 Tabs by mouth nightly as needed (Insomnia). Indications: Insomnia  Qty: 60 Tab, Refills: 0         CONTINUE these medications which have CHANGED    Details   lisinopril (PRINIVIL, ZESTRIL) 10 mg tablet Take 1 Tab by mouth daily. Indications: high blood pressure  Qty: 30 Tab, Refills: 0       OXcarbazepine (TRILEPTAL) 600 mg tablet Take 1 Tab by mouth two (2) times a day. Indications: Bipolar disorder  Qty: 60 Tab, Refills: 0      ziprasidone (GEODON) 60 mg capsule Take 1 Cap by mouth two (2) times daily (with meals). Indications: Mania associated with Bipolar Disorder  Qty: 60 Cap, Refills: 0         CONTINUE these medications which have NOT CHANGED    Details   esomeprazole (NEXIUM) 40 mg capsule Take 40 mg by mouth daily.      acetaminophen (TYLENOL) 325 mg tablet Take 2 Tabs by mouth every four (4) hours as needed for Pain.  Qty: 20 Tab, Refills: 0      naproxen (NAPROSYN) 375 mg tablet Take 1 Tab by mouth two (2) times daily (with meals).  Qty: 20 Tab, Refills: 0         STOP taking these medications       benztropine (COGENTIN) 2 mg tablet Comments:   Reason for Stopping:               Unresulted Labs (24h ago, onward)    Start     Ordered    07/10/17 2030  HEMOGLOBIN A1C WITH EAG  ONE TIME,   R     Start Status     07/10/17 2030  Order ID: [id number]       07/10/17 2028    07/10/17 2030  GLUCOSE, FASTING  ONE TIME,   R     Start Status     07/10/17 2030  Order ID: [id number]       07/10/17 2028    07/10/17 2030  LIPID PANEL  ONE TIME,   R     Start Status     07/10/17 2030  Order ID: [id number]       07/10/17 2028    Pending  GLUCOSE, FASTING  TOMORROW AM,   R      Pending    Pending  HEMOGLOBIN A1C WITH EAG  TOMORROW AM,   R      Pending    Pending  LIPID PANEL  TOMORROW AM,   R      Pending        To obtain results of studies pending at discharge, please contact Medical Records : [phone]    Follow-up Information     Follow up With Specialties Details Why Contact Info    Mitzi Hansen  Tamala Julian, PMHNP  Go on 07/21/2017 Medication management appointment on Thursday, May 9th at 9:30 AM.  [facility]  Address:  [address] [place] [address]  Phone:  [phone]  Fax:  [phone]    [place] today This is the agency you have been connected with to  begin Crisis Stabilization and mental health skill building services.  Address: [address], [place] [address]  Phone: [phone]              Advanced Directive:   Does the patient have an appointed surrogate decision maker? Unknown   Does the patient have a Medical Advance Directive? Unknown   Does the patient have a Psychiatric Advance Directive? Unknown   If the patient does not have a surrogate or Medical Advance Directive AND Psychiatric Advance Directive, the patient was offered information on these advance directives.  Unknown        Patient Instructions: Please continue all medications until otherwise directed by physician.      Tobacco Cessation Discharge Plan:   Is the patient a smoker and needs referral for smoking cessation? No  Patient referred to the following for smoking cessation with an appointment? No   Patient was offered medication to assist with smoking cessation at discharge? No  Was education for smoking cessation added to the discharge instructions? No     Alcohol/Substance Abuse Discharge Plan:   Does the patient have a history of substance/alcohol abuse and requires a referral for treatment? No  Patient referred to the following for substance/alcohol abuse treatment with an appointment? No  Patient was offered medication to assist with alcohol cessation at discharge? No  Was education for substance/alcohol abuse added to discharge instructions? No     Patient discharged to Home; provided to the patient/caregiver either in hard copy or electronically.  Continuing care paperwork was faxed to community mental health providers.

## 2017-07-11 NOTE — Behavioral Health Treatment Team (Signed)
Social work process group    Patient did not attend social work process group.

## 2017-10-26 ENCOUNTER — Encounter: Attending: Psychiatric/Mental Health

## 2018-02-25 ENCOUNTER — Emergency Department (HOSPITAL_COMMUNITY)
Admission: EM | Admit: 2018-02-25 | Discharge: 2018-02-25 | Payer: Medicare (Managed Care) | Attending: Emergency Medicine | Admitting: Emergency Medicine

## 2018-02-25 ENCOUNTER — Encounter (HOSPITAL_COMMUNITY): Payer: Self-pay

## 2018-02-25 DIAGNOSIS — D649 Anemia, unspecified: Secondary | ICD-10-CM | POA: Diagnosis not present

## 2018-02-25 DIAGNOSIS — F17210 Nicotine dependence, cigarettes, uncomplicated: Secondary | ICD-10-CM | POA: Insufficient documentation

## 2018-02-25 DIAGNOSIS — I509 Heart failure, unspecified: Secondary | ICD-10-CM | POA: Diagnosis not present

## 2018-02-25 DIAGNOSIS — I110 Hypertensive heart disease with heart failure: Secondary | ICD-10-CM | POA: Insufficient documentation

## 2018-02-25 DIAGNOSIS — Z79899 Other long term (current) drug therapy: Secondary | ICD-10-CM | POA: Diagnosis not present

## 2018-02-25 DIAGNOSIS — N939 Abnormal uterine and vaginal bleeding, unspecified: Secondary | ICD-10-CM | POA: Insufficient documentation

## 2018-02-25 LAB — I-STAT BETA HCG BLOOD, ED (MC, WL, AP ONLY): I-stat hCG, quantitative: <5 m[IU]/mL (ref <5)

## 2018-02-25 LAB — I-STAT CHEM 8, ED
BUN: 4 mg/dL — AB (ref 6–20)
CHLORIDE: 105 mmol/L (ref 98–111)
CREATININE: 0.7 mg/dL (ref 0.44–1.00)
Calcium, Ion: 1.15 mmol/L (ref 1.15–1.40)
Glucose, Bld: 105 mg/dL — ABNORMAL HIGH (ref 70–99)
HEMATOCRIT: 31 % — AB (ref 36.0–46.0)
Hemoglobin: 10.5 g/dL — ABNORMAL LOW (ref 12.0–15.0)
Potassium: 3.6 mmol/L (ref 3.5–5.1)
SODIUM: 138 mmol/L (ref 135–145)
TCO2: 23 mmol/L (ref 22–32)

## 2018-02-25 NOTE — ED Notes (Signed)
While attempting to obtain lab work from pt, pt began getting irate with Clinical research associatewriter and [place] Grove[place], Charity fundraiserN. Pt yelling "you're a white supremus, and I know you'll do what you want".  Jeanice LimHolly, RN asking triage questions and pt stating back "I don't know, is that what my chart says?" in reference to triage questions.

## 2018-02-25 NOTE — ED Notes (Signed)
Bed: [id number] Expected date:  Expected time:  Means of arrival:  Comments: EMS 37 yo female from jail-vaginal bleeding/psych issues/aggressive

## 2018-02-25 NOTE — Discharge Instructions (Signed)
Please follow up with your primary care provider within 5-7 days for re-evaluation of your symptoms. If you do not have a primary care provider, information for a healthcare clinic has been provided for you to make arrangements for follow up care. Please return to the emergency department for any new or worsening symptoms including continued vaginal bleeding, lightheadedness, dizziness, low blood pressure.

## 2018-02-25 NOTE — ED Triage Notes (Signed)
EMS states at 1630 she had to "pee" and she became angry when no one would bring her tissue to wipe with. When patient got the tissue and wiped-small amount vaginal bleeding. Patient has irregular periods but told EMS she has been pregnant with triplets for over a year.

## 2018-02-25 NOTE — ED Notes (Signed)
Patient stating she has been pregnant for a year with triplets, that she carries each baby for 9 months each.

## 2018-02-25 NOTE — ED Notes (Signed)
Pt refusing to keep blood pressure cuff on arm

## 2018-02-25 NOTE — ED Notes (Signed)
This nurse was talking to patient about her symptoms while nurse tech Sam told the patient she was going to get some blood. The patient began to demand a urine sample stating that will tell us everything we need to know. After explaining the need for blood the patient agreed and this nurse explained that we can ask the doctor about a urine sample when they come to discuss her symptoms. She then stated we did not need a tourniquet to get blood, that we didn't know what we were doing. Pt started getting angry stating that we were white supremacist. Corrections officers also at bedside. Will continue to monitor.

## 2018-02-25 NOTE — ED Notes (Signed)
Pt refused to get cleaned up, states that she does not trust us and refuses to use anything we have, pad given.

## 2018-02-25 NOTE — ED Notes (Addendum)
Pt very restless in bed, screaming that her hand-cuffs are "too tight". Corrections officers at bedside.

## 2018-02-25 NOTE — ED Notes (Signed)
Pt undressed and into a gown by Clinical research associatewriter and Myriam JacobsonHelen, EMT.

## 2018-02-25 NOTE — ED Triage Notes (Signed)
[facility]. Chief complaint is vaginal bleeding-patient has mental illness and has been incarcerated ~88 days with no documented medical history. Patient told EMS she is pregnant with "triplets".

## 2018-02-25 NOTE — ED Provider Notes (Signed)
[facility]-EMERGENCY DEPT Provider Note   CSN: [id number] Arrival date & time: 02/25/18  1953     History   Chief Complaint Chief Complaint  Patient presents with  . Vaginal Bleeding    HPI Rebecca Lozano is a 37 y.o. female.  HPI   Patient is a 37 year old female with a history of bipolar 1, CHF, GERD, hypertension, seizures, who presents the emergency department today from jail for evaluation of reported vaginal bleeding.  History limited secondary to patient refusing to answer most questions.  Police are at bedside who state that they were walking by the patient's cell and she had a large amount of vaginal bleeding.  Patient states she had "a pint of blood "from either the urethra or the vagina.  She denies any rectal bleeding.  She will not answer when her last menstrual cycle was.  She denies any trauma to the area.  She states that she is currently pregnant with triplets.  She told nursing staff that she has been pregnant with triplets for many months.  She denies abdominal pain.  On my evaluation she states her symptoms have resolved and that she is "stabilized".  Police at bedside state that patient is at her mental baseline.  Past Medical History:  Diagnosis Date  . Bipolar 1 disorder (HCC)   . CHF (congestive heart failure) (HCC)   . GERD (gastroesophageal reflux disease)   . Hypertension   . Seizures [facility])     Patient Active Problem List   Diagnosis Date Noted  . Bipolar affective disorder, current episode manic with psychotic symptoms (HCC) 03/10/2017  . Psychosis (HCC) 03/09/2017    Past Surgical History:  Procedure Laterality Date  . NO PAST SURGERIES       OB History   No obstetric history on file.      Home Medications    Prior to Admission medications   Medication Sig Start Date End Date Taking? Authorizing Provider  acetaminophen (TYLENOL) 500 MG tablet Take 2 tablets (1,000 mg total) by mouth 2 (two) times daily as needed  (pain). 03/23/17   Armandina Stammer I, NP  benztropine (COGENTIN) 1 MG tablet Take 1 tablet (1 mg total) by mouth 2 (two) times daily. For prevention of drug induced tremors 03/23/17   Armandina Stammer I, NP  calcium carbonate (TUMS - DOSED IN MG ELEMENTAL CALCIUM) 500 MG chewable tablet Chew 1 tablet (200 mg of elemental calcium total) by mouth as needed for indigestion or heartburn. 03/23/17   Armandina Stammer I, NP  gabapentin (NEURONTIN) 300 MG capsule Take 2 capsules (600 mg total) by mouth 2 (two) times daily. For agitation 03/23/17   Armandina Stammer I, NP  hydrOXYzine (ATARAX/VISTARIL) 50 MG tablet Take 1 tablet (50 mg total) by mouth 2 (two) times daily. For anxiety 03/23/17   Armandina Stammer I, NP  LORazepam (ATIVAN) 1 MG tablet Take 1 tablet (1 mg total) by mouth every 6 (six) hours as needed for anxiety. 03/23/17   Armandina Stammer I, NP  traZODone (DESYREL) 100 MG tablet Take 1 tablet (100 mg) by mouth at bedtime: For sleep 03/23/17   Armandina Stammer I, NP  ziprasidone (GEODON) 80 MG capsule Take 1 capsule (80 mg total) by mouth 2 (two) times daily with a meal. For mood control 03/23/17   Sanjuana Kava, NP    Family History No family history on file.  Social History Social History   Tobacco Use  . Smoking status: Current Every Day  Smoker    Packs/day: 1.00    Types: Cigarettes, Cigars  . Smokeless tobacco: Never Used  Substance Use Topics  . Alcohol use: No  . Drug use: No     Allergies   Penicillins   Review of Systems Review of Systems  Reason unable to perform ROS: pt refuses to answer questions.  Gastrointestinal: Negative for abdominal pain.  Genitourinary: Positive for vaginal bleeding.     Physical Exam Updated Vital Signs BP (!) 131/99 (BP Location: Right Arm)   Temp 98.6 F (37 C) (Oral)   Resp 18   Ht 5\' 9"  (1.753 m)   Wt 117.9 kg   SpO2 100%   BMI 38.40 kg/m   Physical Exam Vitals signs and nursing note reviewed. Exam conducted with a chaperone present.  Constitutional:       General: She is not in acute distress.    Appearance: She is not ill-appearing or toxic-appearing.  HENT:     Head: Normocephalic and atraumatic.     Mouth/Throat:     Comments: Lips are dry Eyes:     Conjunctiva/sclera: Conjunctivae normal.  Neck:     Musculoskeletal: Neck supple.  Cardiovascular:     Rate and Rhythm: Normal rate.  Pulmonary:     Effort: Pulmonary effort is normal.  Abdominal:     General: Abdomen is flat.  Musculoskeletal:     Comments: Pt in handcuffs  Skin:    General: Skin is warm.  Neurological:     Mental Status: She is alert.  Psychiatric:     Comments: Agitated, aggressive. Delusional.      ED Treatments / Results  Labs (all labs ordered are listed, but only abnormal results are displayed) Labs Reviewed  I-STAT CHEM 8, ED - Abnormal; Notable for the following components:      Result Value   BUN 4 (*)    Glucose, Bld 105 (*)    Hemoglobin 10.5 (*)    HCT 31.0 (*)    All other components within normal limits  I-STAT BETA HCG BLOOD, ED ([facility], [facility], [facility] ONLY)    EKG None  Radiology No results found.  Procedures Procedures (including critical care time)  Medications Ordered in ED Medications - No data to display   Initial Impression / Assessment and Plan / ED Course  I have reviewed the triage vital signs and the nursing notes.  Pertinent labs & imaging results that were available during my care of the patient were reviewed by me and considered in my medical decision making (see chart for details).     Final Clinical Impressions(s) / ED Diagnoses   Final diagnoses:  Vaginal bleeding  Anemia, unspecified type   Patient presenting for evaluation of vaginal bleeding that was noted by patient and police prior to arrival.  She is coming from jail.  She is not cooperative with the exam and refuses to answer questions however denies any trauma.  She will not answer when her last menstrual cycle was.  She is delusional and is convinced that  she is currently pregnant with triplets.  Her beta hCG is negative.  Police at bedside states that she is at her mental baseline currently.  She is very agitated and aggressive during the evaluation and frequently curses at myself and yells profanities and insults.  She repeatedly asks for discharge paperwork throughout the exam.  Discussed the importance of bleeding a pelvic exam to rule out any trauma or ongoing vaginal bleeding however patient declines and  states she would like to be discharged.  She states that her problem is a "lack of water ".  Patient's vitals are stable.  She is not hypotensive or tachycardic.  As above her beta hCG is negative.  Her hemoglobin is slightly low at 10.5 however this is actually improved from her prior lab work.  Given that patient is refusing pelvic exam, feel that she is stable for discharge at this time with plan to follow-up if symptoms worsen.  Discharged in stable condition.  ED Discharge Orders    None       Rayne DuCouture, Ronald Londo S, PA-C 02/25/18 2157    Linwood DibblesKnapp, Jon, MD 02/25/18 2352

## 2019-02-13 DEATH — deceased

## 2019-03-22 IMAGING — CT CT HEAD W/O CM
4 series · 17 of 47 positions shown, 19 images · non-contrast
Comparison: None.

CLINICAL DATA: Altered mental status.

EXAM:
CT HEAD WITHOUT CONTRAST
TECHNIQUE: Contiguous axial images were obtained from the base of the skull
through the vertex without intravenous contrast.

[Series 3: head without · axial · non-contrast · 0.45mm/px · z∈[-119,+11]mm · 7 of 36 slices shown, 9 images]
[im 5/36  brain]
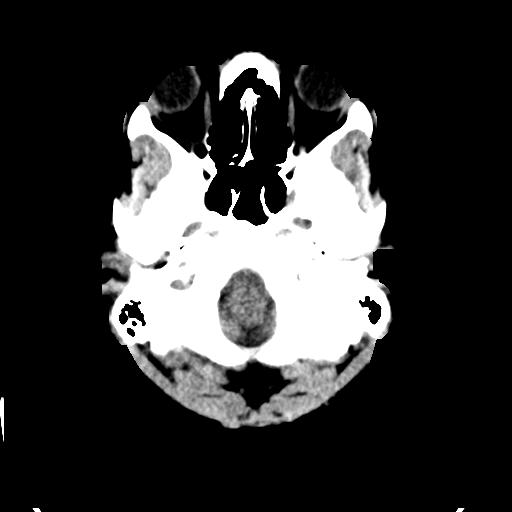
[im 5/36  bone]
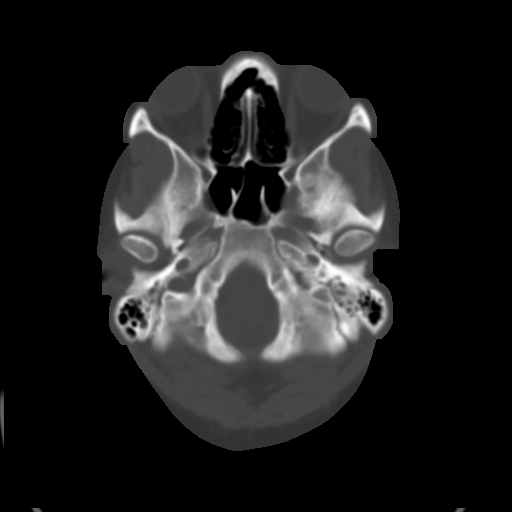
[im 9/36  brain]
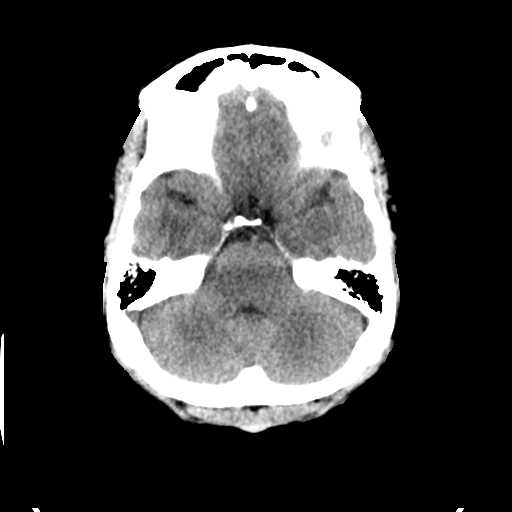
[im 14/36  brain]
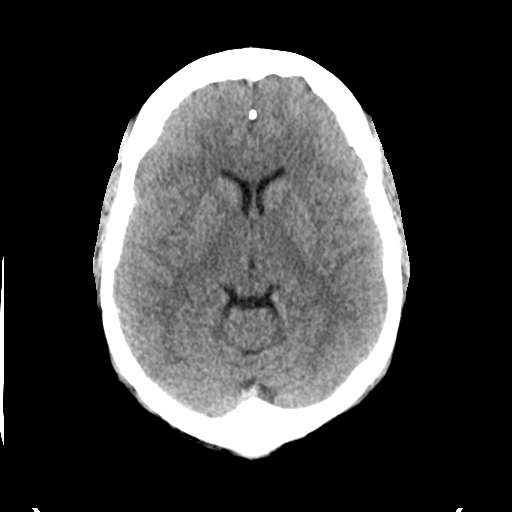
[im 18/36  brain]
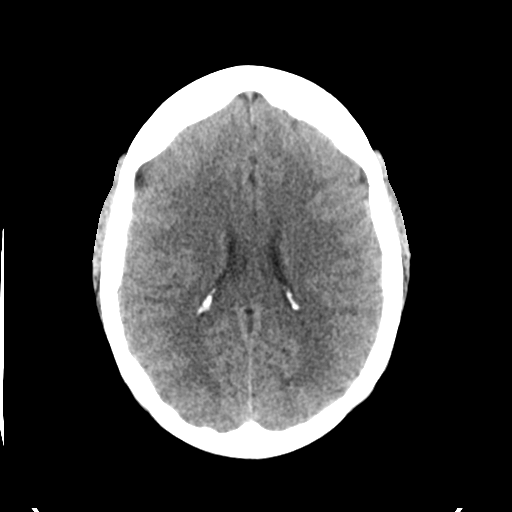
[im 22/36  brain]
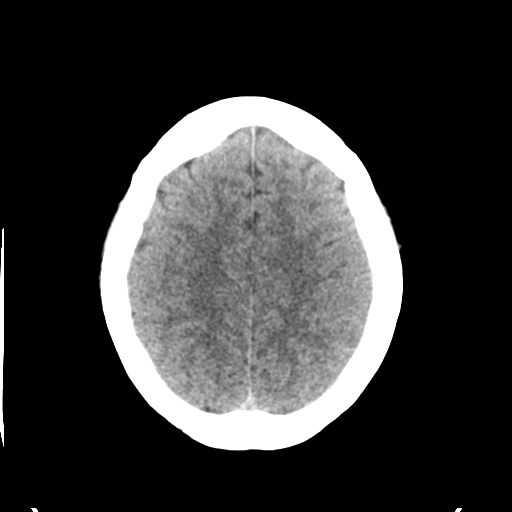
[im 22/36  bone]
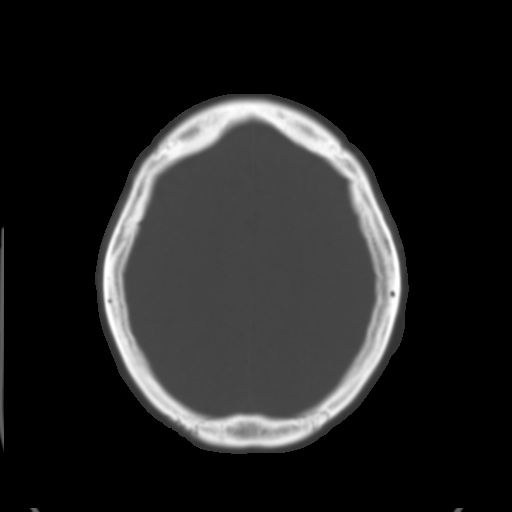
[im 27/36  brain]
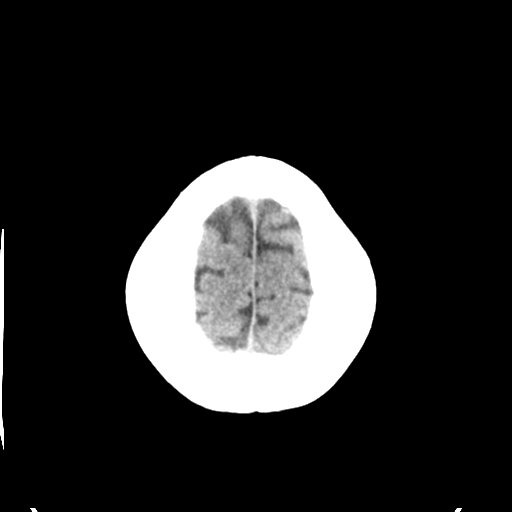
[im 31/36  brain]
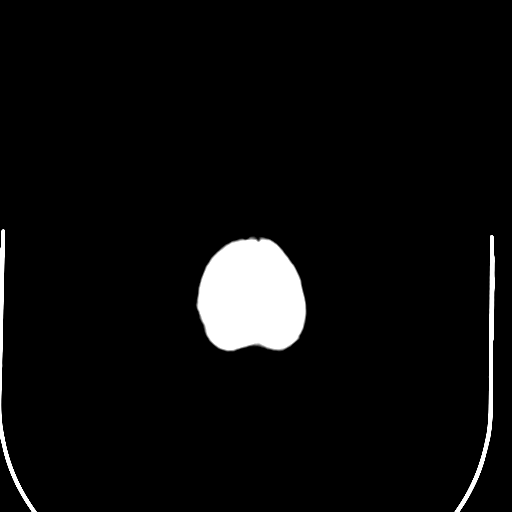

[Series 4: head bone · axial · 0.45mm/px · z∈[-123,-59]mm · 4 of 90 slices shown]
[im 9/90  bone]
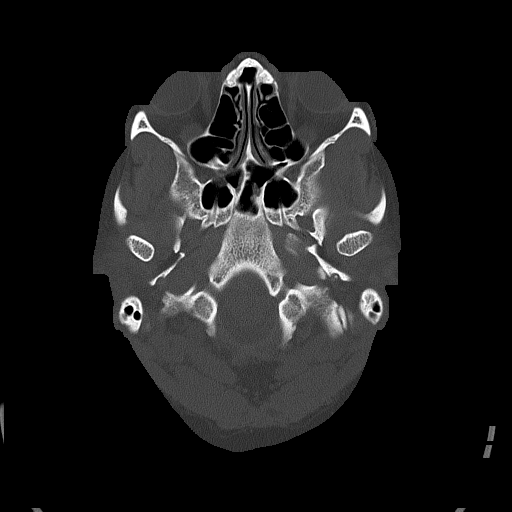
[im 18/90  bone]
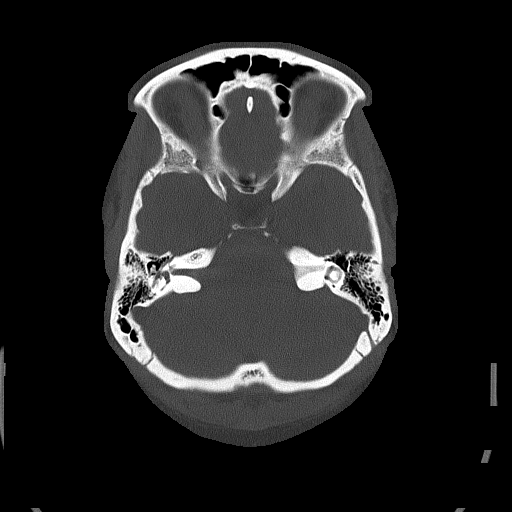
[im 27/90  bone]
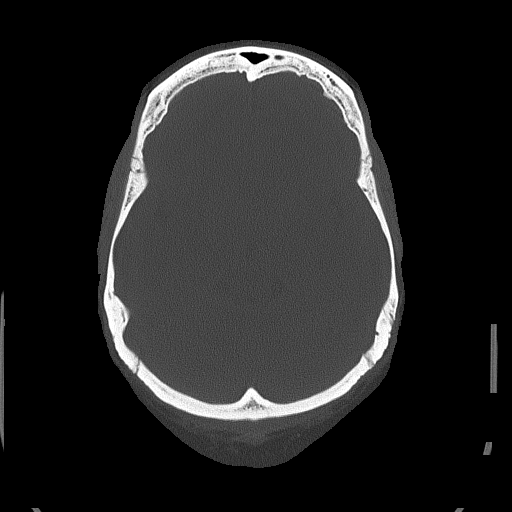
[im 41/90  bone]
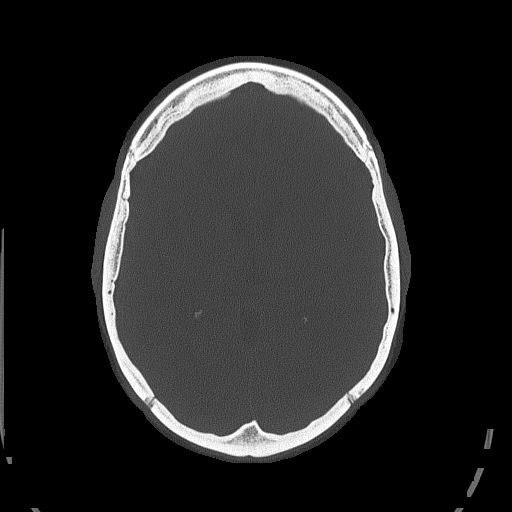

[Series 5: head without cor · coronal · non-contrast · 0.33mm/px · 3 of 69 slices shown]
[im 23/69  brain]
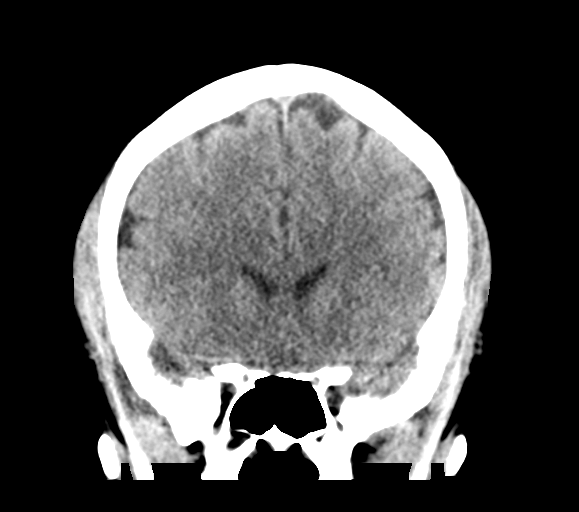
[im 31/69  brain]
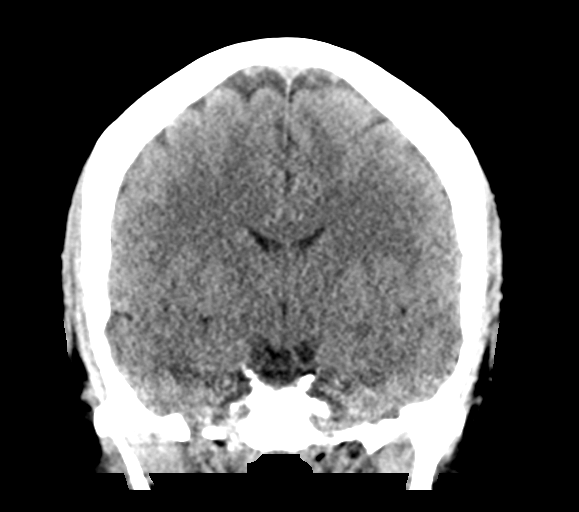
[im 38/69  brain]
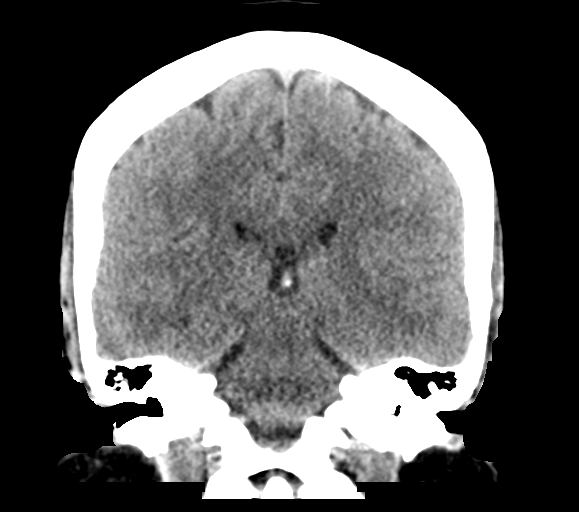

[Series 6: head without sag · sagittal · non-contrast · 0.39mm/px · 3 of 66 slices shown]
[im 22/66  brain]
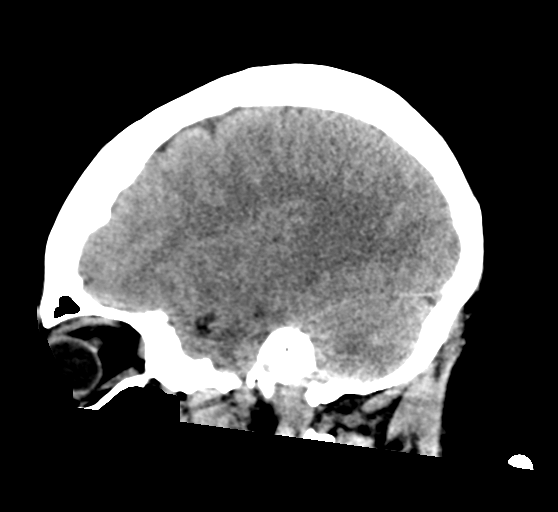
[im 33/66  brain]
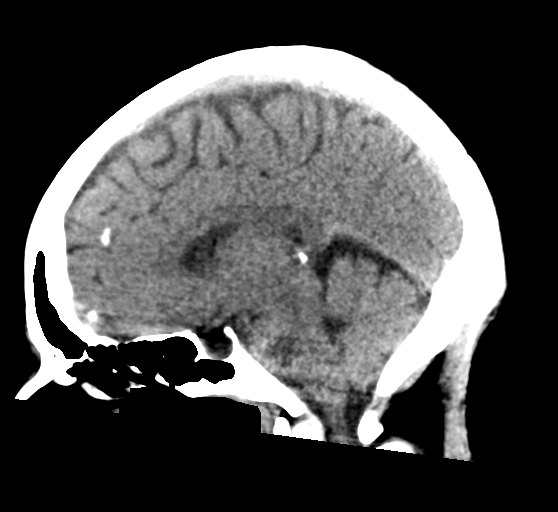
[im 44/66  brain]
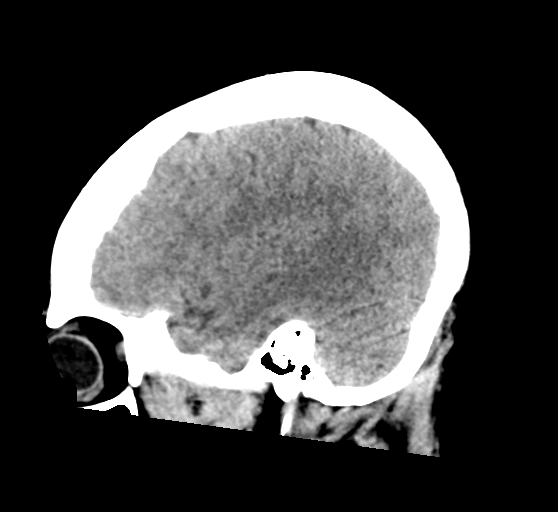

[17 of 47 positions shown; findings below may reference images not displayed]

FINDINGS: Brain: No evidence of acute infarction, hemorrhage, hydrocephalus,
extra-axial collection or mass lesion/mass effect.

Vascular: No hyperdense vessel or unexpected calcification.

Skull: Normal. Negative for fracture or focal lesion.

Sinuses/Orbits: Globes and orbits are unremarkable. Visualized
sinuses and mastoid air cells are clear.

Other: None.
IMPRESSION: Normal unenhanced CT scan of the brain.
